# Patient Record
Sex: Female | Born: 1955
Health system: Southern US, Community
[De-identification: ages and names within clinical notes are randomized; demographics above are authoritative.]

## PROBLEM LIST (undated history)

## (undated) DIAGNOSIS — E079 Disorder of thyroid, unspecified: Secondary | ICD-10-CM

## (undated) DIAGNOSIS — T7840XA Allergy, unspecified, initial encounter: Secondary | ICD-10-CM

## (undated) HISTORY — DX: Allergy, unspecified, initial encounter: T78.40XA

## (undated) HISTORY — DX: Disorder of thyroid, unspecified: E07.9

## (undated) HISTORY — PX: TUBAL LIGATION: SHX77

---

## 2007-07-05 LAB — HM COLONOSCOPY: HM COLON: NORMAL

## 2014-04-10 LAB — TSH: TSH: 3 u[IU]/mL (ref <=5.90)

## 2014-04-10 LAB — LIPID PANEL
CHOLESTEROL: 243 mg/dL — AB (ref 0–200)
HDL: 55 mg/dL (ref 35–70)
LDL Cholesterol: 163 mg/dL
Triglycerides: 126 mg/dL (ref 40–160)

## 2014-04-10 LAB — CBC AND DIFFERENTIAL: Hemoglobin: 13.9 g/dL (ref 12.0–16.0)

## 2014-05-15 LAB — HM PAP SMEAR: HM PAP: NORMAL

## 2014-10-23 LAB — HM MAMMOGRAPHY: HM MAMMO: NORMAL

## 2015-04-21 ENCOUNTER — Other Ambulatory Visit: Payer: Self-pay | Admitting: Internal Medicine

## 2015-04-21 ENCOUNTER — Encounter: Payer: Self-pay | Admitting: Internal Medicine

## 2015-04-21 DIAGNOSIS — M722 Plantar fascial fibromatosis: Secondary | ICD-10-CM | POA: Insufficient documentation

## 2015-04-21 DIAGNOSIS — Z86010 Personal history of colonic polyps: Secondary | ICD-10-CM | POA: Insufficient documentation

## 2015-04-21 DIAGNOSIS — E782 Mixed hyperlipidemia: Secondary | ICD-10-CM | POA: Insufficient documentation

## 2015-04-21 DIAGNOSIS — R87615 Unsatisfactory cytologic smear of cervix: Secondary | ICD-10-CM | POA: Insufficient documentation

## 2015-04-21 DIAGNOSIS — N951 Menopausal and female climacteric states: Secondary | ICD-10-CM | POA: Insufficient documentation

## 2015-04-21 DIAGNOSIS — E039 Hypothyroidism, unspecified: Secondary | ICD-10-CM | POA: Insufficient documentation

## 2015-04-21 DIAGNOSIS — Z8709 Personal history of other diseases of the respiratory system: Secondary | ICD-10-CM | POA: Insufficient documentation

## 2015-05-14 ENCOUNTER — Encounter: Payer: Self-pay | Admitting: Internal Medicine

## 2015-05-14 ENCOUNTER — Ambulatory Visit (INDEPENDENT_AMBULATORY_CARE_PROVIDER_SITE_OTHER): Payer: 59 | Admitting: Internal Medicine

## 2015-05-14 ENCOUNTER — Ambulatory Visit: Payer: Self-pay | Admitting: Internal Medicine

## 2015-05-14 VITALS — BP 136/84 | HR 72 | Ht 60.0 in | Wt 144.8 lb

## 2015-05-14 DIAGNOSIS — E038 Other specified hypothyroidism: Secondary | ICD-10-CM

## 2015-05-14 DIAGNOSIS — K5900 Constipation, unspecified: Secondary | ICD-10-CM | POA: Insufficient documentation

## 2015-05-14 DIAGNOSIS — Z23 Encounter for immunization: Secondary | ICD-10-CM | POA: Diagnosis not present

## 2015-05-14 DIAGNOSIS — E7849 Other hyperlipidemia: Secondary | ICD-10-CM

## 2015-05-14 DIAGNOSIS — Z1239 Encounter for other screening for malignant neoplasm of breast: Secondary | ICD-10-CM | POA: Diagnosis not present

## 2015-05-14 DIAGNOSIS — E034 Atrophy of thyroid (acquired): Secondary | ICD-10-CM | POA: Diagnosis not present

## 2015-05-14 DIAGNOSIS — Z0000 Encounter for general adult medical examination without abnormal findings: Secondary | ICD-10-CM | POA: Diagnosis not present

## 2015-05-14 DIAGNOSIS — E784 Other hyperlipidemia: Secondary | ICD-10-CM | POA: Diagnosis not present

## 2015-05-14 LAB — POCT URINALYSIS DIPSTICK
BILIRUBIN UA: NEGATIVE
Glucose, UA: NEGATIVE
Ketones, UA: NEGATIVE
Leukocytes, UA: NEGATIVE
NITRITE UA: NEGATIVE
PH UA: 5
PROTEIN UA: NEGATIVE
RBC UA: NEGATIVE
Spec Grav, UA: 1.01
UROBILINOGEN UA: 0.2

## 2015-05-14 MED ORDER — LEVOTHYROXINE SODIUM 50 MCG PO TABS: 50.0000 ug | ORAL_TABLET | Freq: Every day | ORAL | Status: DC

## 2015-05-14 NOTE — Progress Notes (Signed)
Date:  05/14/2015   Name:  Evelyn Daniels   DOB:  07-02-1956   MRN:  [id number]   Chief Complaint: Annual Exam and Hypothyroidism Thyroid Problem Presents for follow-up visit. Symptoms include constipation. Patient reports no anxiety, depressed mood, diaphoresis, hair loss, hoarse voice, leg swelling, menstrual problem, palpitations, tremors or weight loss. The symptoms have been stable. Past treatments include levothyroxine. The treatment provided significant relief.  Constipation This is a chronic problem. The problem is unchanged. Her stool frequency is 1 time per day. The stool is described as scybalous. The patient is on a high fiber diet. She does not exercise regularly. There has been adequate water intake. Associated symptoms include hemorrhoids. Pertinent negatives include no abdominal pain, fever, hematochezia, melena, vomiting or weight loss. Treatments tried: herbal supplement. The treatment provided no relief.    Evelyn Daniels is a 59 y.o. female who presents today for her Complete Annual Exam. She feels fairly well. She reports exercising none. She reports she is sleeping well. She has no breast problems.  Mammogram is due in April.   Review of Systems  Constitutional: Negative for fever, chills, weight loss, diaphoresis and appetite change.  HENT: Negative for ear pain, hoarse voice, trouble swallowing and voice change.   Eyes: Negative for visual disturbance.  Respiratory: Negative for cough, chest tightness and shortness of breath.   Cardiovascular: Negative for chest pain, palpitations and leg swelling.  Gastrointestinal: Positive for constipation and hemorrhoids. Negative for vomiting, abdominal pain, melena, hematochezia and anal bleeding.  Genitourinary: Negative for dysuria, hematuria, vaginal bleeding, vaginal discharge, vaginal pain, menstrual problem and pelvic pain.  Musculoskeletal: Positive for arthralgias (knee and shoulder on left). Negative for joint swelling and gait  problem.  Skin: Negative for rash.  Neurological: Negative for tremors, weakness, light-headedness, numbness and headaches.  Hematological: Negative for adenopathy. Does not bruise/bleed easily.  Psychiatric/Behavioral: Negative for dysphoric mood. The patient is not nervous/anxious.     Patient Active Problem List   Diagnosis Date Noted  . Hypothyroidism 04/21/2015  . Familial multiple lipoprotein-type hyperlipidemia 04/21/2015  . History of hay fever 04/21/2015  . History of colon polyps 04/21/2015  . Hot flash, menopausal 04/21/2015  . Plantar fasciitis 04/21/2015  . Unsatisfactory cytologic smear of cervix 04/21/2015    Prior to Admission medications   Medication Sig Start Date End Date Taking? Authorizing Provider  Cholecalciferol 1000 UNITS capsule Take by mouth.   Yes Historical Provider, MD  Cinnamon 500 MG capsule Take 500 mg by mouth daily.   Yes Historical Provider, MD  levothyroxine (SYNTHROID) 50 MCG tablet Take 1 tablet by mouth daily. 06/02/14  Yes Historical Provider, MD  loratadine (CLARITIN) 10 MG tablet Take 1 tablet by mouth daily.   Yes Historical Provider, MD  Multiple Vitamins-Minerals (WOMENS MULTIVITAMIN PLUS) TABS Take 1 tablet by mouth daily as needed.   Yes Historical Provider, MD  Omega-3 Fatty Acids (FISH OIL) 1000 MG CAPS    Yes Historical Provider, MD  vitamin C (ASCORBIC ACID) 500 MG tablet Take 500 mg by mouth daily.   Yes Historical Provider, MD    Allergies  Allergen Reactions  . Codeine Anaphylaxis    Past Surgical History  Procedure Laterality Date  . Tubal ligation      Social History  Substance Use Topics  . Smoking status: Never Smoker   . Smokeless tobacco: None  . Alcohol Use: 1.2 oz/week    2 Standard drinks or equivalent per week    Medication list has been reviewed  and updated.   Physical Exam  Constitutional: She is oriented to person, place, and time. She appears well-developed and well-nourished. No distress.  HENT:   Head: Normocephalic and atraumatic.  Right Ear: Tympanic membrane and ear canal normal.  Left Ear: Tympanic membrane and ear canal normal.  Nose: Right sinus exhibits no maxillary sinus tenderness. Left sinus exhibits no maxillary sinus tenderness.  Mouth/Throat: Uvula is midline and oropharynx is clear and moist.  Eyes: Conjunctivae and EOM are normal. Right eye exhibits no discharge. Left eye exhibits no discharge. No scleral icterus.  Neck: Normal range of motion. Carotid bruit is not present. No erythema present. No thyromegaly present.  Cardiovascular: Normal rate, regular rhythm, normal heart sounds and normal pulses.   Pulmonary/Chest: Effort normal. No respiratory distress. She has no wheezes. Right breast exhibits no mass, no nipple discharge, no skin change and no tenderness. Left breast exhibits no mass, no nipple discharge, no skin change and no tenderness.  Abdominal: Soft. Bowel sounds are normal. There is no hepatosplenomegaly. There is no tenderness. There is no CVA tenderness.  Musculoskeletal: Normal range of motion.  Lymphadenopathy:    She has no cervical adenopathy.    She has no axillary adenopathy.  Neurological: She is alert and oriented to person, place, and time. She has normal reflexes. No cranial nerve deficit or sensory deficit.  Skin: Skin is warm, dry and intact. No rash noted.  Psychiatric: She has a normal mood and affect. Her speech is normal and behavior is normal. Thought content normal.  Nursing note and vitals reviewed.   BP 148/74 mmHg  Pulse 72  Ht 5' (1.524 m)  Wt 144 lb 12.8 oz (65.681 kg)  BMI 28.28 kg/m2  Assessment and Plan: 1. Annual physical exam Patient will schedule colonoscopy  - POCT urinalysis dipstick - CBC with Differential/Platelet - Comprehensive metabolic panel - Hepatitis C antibody  2. Flu vaccine need - Flu Vaccine QUAD 36+ mos PF IM (Fluarix & Fluzone Quad PF)  3. Hypothyroidism due to acquired atrophy of  thyroid Supplemented; adjust dose as needed - TSH - levothyroxine (SYNTHROID) 50 MCG tablet; Take 1 tablet (50 mcg total) by mouth daily.  Dispense: 30 tablet; Refill: 12  4. Familial multiple lipoprotein-type hyperlipidemia Will advise on medication when labs return Last assessment - 10 yr risk 3% - Lipid panel  5. Breast cancer screening Mammogram annually at [facility]  6. Need for diphtheria-tetanus-pertussis (Tdap) vaccine - Tdap vaccine greater than or equal to 7yo IM  7. Constipation, unspecified constipation type Recommend MiraLAX daily or stool softener with vegetable laxative   Halina Maidens, MD [facility]  05/14/2015

## 2015-05-14 NOTE — Patient Instructions (Addendum)
For Constipation - Miralax (glycolax) powder daily  Or Stool softener with vegetable laxative 1-2 daily as needed     Breast Self-Awareness Practicing breast self-awareness may pick up problems early, prevent significant medical complications, and possibly save your life. By practicing breast self-awareness, you can become familiar with how your breasts look and feel and if your breasts are changing. This allows you to notice changes early. It can also offer you some reassurance that your breast health is good. One way to learn what is normal for your breasts and whether your breasts are changing is to do a breast self-exam. If you find a lump or something that was not present in the past, it is best to contact your caregiver right away. Other findings that should be evaluated by your caregiver include nipple discharge, especially if it is bloody; skin changes or reddening; areas where the skin seems to be pulled in (retracted); or new lumps and bumps. Breast pain is seldom associated with cancer (malignancy), but should also be evaluated by a caregiver. HOW TO PERFORM A BREAST SELF-EXAM The best time to examine your breasts is 5-7 days after your menstrual period is over. During menstruation, the breasts are lumpier, and it may be more difficult to pick up changes. If you do not menstruate, have reached menopause, or had your uterus removed (hysterectomy), you should examine your breasts at regular intervals, such as monthly. If you are breastfeeding, examine your breasts after a feeding or after using a breast pump. Breast implants do not decrease the risk for lumps or tumors, so continue to perform breast self-exams as recommended. Talk to your caregiver about how to determine the difference between the implant and breast tissue. Also, talk about the amount of pressure you should use during the exam. Over time, you will become more familiar with the variations of your breasts and more comfortable with  the exam. A breast self-exam requires you to remove all your clothes above the waist.  Look at your breasts and nipples. Stand in front of a mirror in a room with good lighting. With your hands on your hips, push your hands firmly downward. Look for a difference in shape, contour, and size from one breast to the other (asymmetry). Asymmetry includes puckers, dips, or bumps. Also, look for skin changes, such as reddened or scaly areas on the breasts. Look for nipple changes, such as discharge, dimpling, repositioning, or redness.  Carefully feel your breasts. This is best done either in the shower or tub while using soapy water or when flat on your back. Place the arm (on the side of the breast you are examining) above your head. Use the pads (not the fingertips) of your three middle fingers on your opposite hand to feel your breasts. Start in the underarm area and use  inch (2 cm) overlapping circles to feel your breast. Use 3 different levels of pressure (light, medium, and firm pressure) at each circle before moving to the next circle. The light pressure is needed to feel the tissue closest to the skin. The medium pressure will help to feel breast tissue a little deeper, while the firm pressure is needed to feel the tissue close to the ribs. Continue the overlapping circles, moving downward over the breast until you feel your ribs below your breast. Then, move one finger-width towards the center of the body. Continue to use the  inch (2 cm) overlapping circles to feel your breast as you move slowly up toward the collar  bone (clavicle) near the base of the neck. Continue the up and down exam using all 3 pressures until you reach the middle of the chest. Do this with each breast, carefully feeling for lumps or changes.   Keep a written record with breast changes or normal findings for each breast. By writing this information down, you do not need to depend only on memory for size, tenderness, or location.  Write down where you are in your menstrual cycle, if you are still menstruating. Breast tissue can have some lumps or thick tissue. However, see your caregiver if you find anything that concerns you.  SEEK MEDICAL CARE IF:  You see a change in shape, contour, or size of your breasts or nipples.   You see skin changes, such as reddened or scaly areas on the breasts or nipples.   You have an unusual discharge from your nipples.   You feel a new lump or unusually thick areas.    This information is not intended to replace advice given to you by your health care provider. Make sure you discuss any questions you have with your health care provider.   Document Released: 06/19/2005 Document Revised: 06/05/2012 Document Reviewed: 10/04/2011 Elsevier Interactive Patient Education 2016 [employer]. Tdap Vaccine (Tetanus, Diphtheria and Pertussis): What You Need to Know 1. Why get vaccinated? Tetanus, diphtheria and pertussis are very serious diseases. Tdap vaccine can protect [place] from these diseases. And, Tdap vaccine given to pregnant women can protect newborn babies against pertussis. TETANUS (Lockjaw) is rare in the [place] today. It causes painful muscle tightening and stiffness, usually all over the body.  It can lead to tightening of muscles in the head and neck so you can't open your mouth, swallow, or sometimes even breathe. Tetanus kills about 1 out of 10 people who are infected even after receiving the best medical care. DIPHTHERIA is also rare in the [place] today. It can cause a thick coating to form in the back of the throat.  It can lead to breathing problems, heart failure, paralysis, and death. PERTUSSIS (Whooping Cough) causes severe coughing spells, which can cause difficulty breathing, vomiting and disturbed sleep.  It can also lead to weight loss, incontinence, and rib fractures. Up to 2 in 100 adolescents and 5 in 100 adults with pertussis are hospitalized or  have complications, which could include pneumonia or death. These diseases are caused by bacteria. Diphtheria and pertussis are spread from person to person through secretions from coughing or sneezing. Tetanus enters the body through cuts, scratches, or wounds. Before vaccines, as many as 200,000 cases of diphtheria, 200,000 cases of pertussis, and hundreds of cases of tetanus, were reported in the [place] each year. Since vaccination began, reports of cases for tetanus and diphtheria have dropped by about 99% and for pertussis by about 80%. 2. Tdap vaccine Tdap vaccine can protect adolescents and adults from tetanus, diphtheria, and pertussis. One dose of Tdap is routinely given at age 79 or 40. People who did not get Tdap at that age should get it as soon as possible. Tdap is especially important for healthcare professionals and anyone having close contact with a baby younger than 12 months. Pregnant women should get a dose of Tdap during every pregnancy, to protect the newborn from pertussis. Infants are most at risk for severe, life-threatening complications from pertussis. Another vaccine, called Td, protects against tetanus and diphtheria, but not pertussis. A Td booster should be given every 10 years. Tdap may  be given as one of these boosters if you have never gotten Tdap before. Tdap may also be given after a severe cut or burn to prevent tetanus infection. Your doctor or the person giving you the vaccine can give you more information. Tdap may safely be given at the same time as other vaccines. 3. Some people should not get this vaccine  A person who has ever had a life-threatening allergic reaction after a previous dose of any diphtheria, tetanus or pertussis containing vaccine, OR has a severe allergy to any part of this vaccine, should not get Tdap vaccine. Tell the person giving the vaccine about any severe allergies.  Anyone who had coma or long repeated seizures within 7 days  after a childhood dose of DTP or DTaP, or a previous dose of Tdap, should not get Tdap, unless a cause other than the vaccine was found. They can still get Td.  Talk to your doctor if you:  have seizures or another nervous system problem,  had severe pain or swelling after any vaccine containing diphtheria, tetanus or pertussis,  ever had a condition called Guillain-Barr Syndrome (GBS),  aren't feeling well on the day the shot is scheduled. 4. Risks With any medicine, including vaccines, there is a chance of side effects. These are usually mild and go away on their own. Serious reactions are also possible but are rare. Most people who get Tdap vaccine do not have any problems with it. Mild problems following Tdap (Did not interfere with activities)  Pain where the shot was given (about 3 in 4 adolescents or 2 in 3 adults)  Redness or swelling where the shot was given (about 1 person in 5)  Mild fever of at least 100.32F (up to about 1 in 25 adolescents or 1 in 100 adults)  Headache (about 3 or 4 people in 10)  Tiredness (about 1 person in 3 or 4)  Nausea, vomiting, diarrhea, stomach ache (up to 1 in 4 adolescents or 1 in 10 adults)  Chills, sore joints (about 1 person in 10)  Body aches (about 1 person in 3 or 4)  Rash, swollen glands (uncommon) Moderate problems following Tdap (Interfered with activities, but did not require medical attention)  Pain where the shot was given (up to 1 in 5 or 6)  Redness or swelling where the shot was given (up to about 1 in 16 adolescents or 1 in 12 adults)  Fever over 102F (about 1 in 100 adolescents or 1 in 250 adults)  Headache (about 1 in 7 adolescents or 1 in 10 adults)  Nausea, vomiting, diarrhea, stomach ache (up to 1 or 3 people in 100)  Swelling of the entire arm where the shot was given (up to about 1 in 500). Severe problems following Tdap (Unable to perform usual activities; required medical attention)  Swelling,  severe pain, bleeding and redness in the arm where the shot was given (rare). Problems that could happen after any vaccine:  People sometimes faint after a medical procedure, including vaccination. Sitting or lying down for about 15 minutes can help prevent fainting, and injuries caused by a fall. Tell your doctor if you feel dizzy, or have vision changes or ringing in the ears.  Some people get severe pain in the shoulder and have difficulty moving the arm where a shot was given. This happens very rarely.  Any medication can cause a severe allergic reaction. Such reactions from a vaccine are very rare, estimated at fewer than 1  in a million doses, and would happen within a few minutes to a few hours after the vaccination. As with any medicine, there is a very remote chance of a vaccine causing a serious injury or death. The safety of vaccines is always being monitored. For more information, visit: http://www.aguilar.org/ 5. What if there is a serious problem? What should I look for?  Look for anything that concerns you, such as signs of a severe allergic reaction, very high fever, or unusual behavior.  Signs of a severe allergic reaction can include hives, swelling of the face and throat, difficulty breathing, a fast heartbeat, dizziness, and weakness. These would usually start a few minutes to a few hours after the vaccination. What should I do?  If you think it is a severe allergic reaction or other emergency that can't wait, call 9-1-1 or get the person to the nearest hospital. Otherwise, call your doctor.  Afterward, the reaction should be reported to the Vaccine Adverse Event Reporting System (VAERS). Your doctor might file this report, or you can do it yourself through the VAERS web site at www.vaers.SamedayNews.es, or by calling (507) 284-3937. VAERS does not give medical advice.  6. The National Vaccine Injury Compensation Program The [employer] Vaccine Injury Compensation Program (VICP) is  a federal program that was created to compensate people who may have been injured by certain vaccines. Persons who believe they may have been injured by a vaccine can learn about the program and about filing a claim by calling 828-408-4070 or visiting the [place] website at GoldCloset.com.ee. There is a time limit to file a claim for compensation. 7. How can I learn more?  Ask your doctor. He or she can give you the vaccine package insert or suggest other sources of information.  Call your local or state health department.  Contact the Centers for Disease Control and Prevention (CDC):  Call 2812768622 (1-800-CDC-INFO) or  Visit CDC's website at http://hunter.com/ CDC Tdap Vaccine VIS (08/26/13)   This information is not intended to replace advice given to you by your health care provider. Make sure you discuss any questions you have with your health care provider.   Document Released: 12/19/2011 Document Revised: 07/10/2014 Document Reviewed: 10/01/2013 Elsevier Interactive Patient Education [employer].

## 2015-05-15 LAB — CBC WITH DIFFERENTIAL/PLATELET
BASOS ABS: 0.1 10*3/uL (ref 0.0–0.2)
Basos: 1 %
EOS (ABSOLUTE): 0.2 10*3/uL (ref 0.0–0.4)
EOS: 4 %
HEMOGLOBIN: 13.7 g/dL (ref 11.1–15.9)
Hematocrit: 40.5 % (ref 34.0–46.6)
Immature Grans (Abs): 0 10*3/uL (ref 0.0–0.1)
Immature Granulocytes: 0 %
LYMPHS ABS: 1.5 10*3/uL (ref 0.7–3.1)
Lymphs: 32 %
MCH: 31 pg (ref 26.6–33.0)
MCHC: 33.8 g/dL (ref 31.5–35.7)
MCV: 92 fL (ref 79–97)
MONOCYTES: 8 %
Monocytes Absolute: 0.4 10*3/uL (ref 0.1–0.9)
Neutrophils Absolute: 2.7 10*3/uL (ref 1.4–7.0)
Neutrophils: 55 %
PLATELETS: 215 10*3/uL (ref 150–379)
RBC: 4.42 x10E6/uL (ref 3.77–5.28)
RDW: 13.4 % (ref 12.3–15.4)
WBC: 4.9 10*3/uL (ref 3.4–10.8)

## 2015-05-15 LAB — COMPREHENSIVE METABOLIC PANEL
ALK PHOS: 90 IU/L (ref 39–117)
ALT: 22 IU/L (ref 0–32)
AST: 19 IU/L (ref 0–40)
Albumin/Globulin Ratio: 2 (ref 1.1–2.5)
Albumin: 4.5 g/dL (ref 3.6–4.8)
BILIRUBIN TOTAL: 0.3 mg/dL (ref 0.0–1.2)
BUN/Creatinine Ratio: 20 (ref 11–26)
BUN: 14 mg/dL (ref 8–27)
CHLORIDE: 102 mmol/L (ref 97–106)
CO2: 26 mmol/L (ref 18–29)
CREATININE: 0.71 mg/dL (ref 0.57–1.00)
Calcium: 9.5 mg/dL (ref 8.7–10.3)
GFR calc Af Amer: 107 mL/min/{1.73_m2} (ref >59)
GFR calc non Af Amer: 93 mL/min/{1.73_m2} (ref >59)
GLUCOSE: 83 mg/dL (ref 65–99)
Globulin, Total: 2.3 g/dL (ref 1.5–4.5)
Potassium: 4.5 mmol/L (ref 3.5–5.2)
Sodium: 142 mmol/L (ref 136–144)
Total Protein: 6.8 g/dL (ref 6.0–8.5)

## 2015-05-15 LAB — LIPID PANEL
CHOLESTEROL TOTAL: 220 mg/dL — AB (ref 100–199)
Chol/HDL Ratio: 4.5 ratio units — ABNORMAL HIGH (ref 0.0–4.4)
HDL: 49 mg/dL (ref >39)
LDL CALC: 148 mg/dL — AB (ref 0–99)
TRIGLYCERIDES: 117 mg/dL (ref 0–149)
VLDL CHOLESTEROL CAL: 23 mg/dL (ref 5–40)

## 2015-05-15 LAB — TSH: TSH: 3.57 u[IU]/mL (ref 0.450–4.500)

## 2015-05-15 LAB — HEPATITIS C ANTIBODY: Hep C Virus Ab: <0.1 s/co ratio (ref 0.0–0.9)

## 2015-06-11 ENCOUNTER — Encounter: Payer: Self-pay | Admitting: Internal Medicine

## 2015-07-30 ENCOUNTER — Encounter: Payer: Self-pay | Admitting: Internal Medicine

## 2015-11-05 ENCOUNTER — Ambulatory Visit: Payer: 59 | Admitting: Internal Medicine

## 2015-11-05 ENCOUNTER — Other Ambulatory Visit: Payer: Self-pay

## 2015-11-05 DIAGNOSIS — Z1211 Encounter for screening for malignant neoplasm of colon: Secondary | ICD-10-CM

## 2015-12-28 ENCOUNTER — Other Ambulatory Visit: Payer: Self-pay | Admitting: Internal Medicine

## 2015-12-28 DIAGNOSIS — Z1211 Encounter for screening for malignant neoplasm of colon: Secondary | ICD-10-CM

## 2016-01-18 ENCOUNTER — Telehealth: Payer: Self-pay

## 2016-01-18 NOTE — Telephone Encounter (Signed)
Called and advised patient that we ordered a screening colonoscopy not a dx

## 2016-02-25 ENCOUNTER — Encounter: Payer: Self-pay | Admitting: Internal Medicine

## 2016-03-03 LAB — HM COLONOSCOPY

## 2016-03-17 ENCOUNTER — Encounter: Payer: Self-pay | Admitting: Internal Medicine

## 2016-05-18 ENCOUNTER — Other Ambulatory Visit: Payer: Self-pay | Admitting: Internal Medicine

## 2016-05-18 DIAGNOSIS — E034 Atrophy of thyroid (acquired): Secondary | ICD-10-CM

## 2016-05-19 ENCOUNTER — Encounter: Payer: 59 | Admitting: Internal Medicine

## 2016-06-16 ENCOUNTER — Ambulatory Visit (INDEPENDENT_AMBULATORY_CARE_PROVIDER_SITE_OTHER): Payer: BLUE CROSS/BLUE SHIELD | Admitting: Internal Medicine

## 2016-06-16 ENCOUNTER — Encounter: Payer: Self-pay | Admitting: Internal Medicine

## 2016-06-16 VITALS — BP 122/82 | HR 84 | Temp 98.0°F | Ht 60.0 in | Wt 143.0 lb

## 2016-06-16 DIAGNOSIS — Z23 Encounter for immunization: Secondary | ICD-10-CM

## 2016-06-16 DIAGNOSIS — Z1231 Encounter for screening mammogram for malignant neoplasm of breast: Secondary | ICD-10-CM | POA: Diagnosis not present

## 2016-06-16 DIAGNOSIS — E782 Mixed hyperlipidemia: Secondary | ICD-10-CM | POA: Diagnosis not present

## 2016-06-16 DIAGNOSIS — Z0000 Encounter for general adult medical examination without abnormal findings: Secondary | ICD-10-CM | POA: Diagnosis not present

## 2016-06-16 DIAGNOSIS — E034 Atrophy of thyroid (acquired): Secondary | ICD-10-CM | POA: Diagnosis not present

## 2016-06-16 DIAGNOSIS — Z86010 Personal history of colonic polyps: Secondary | ICD-10-CM | POA: Diagnosis not present

## 2016-06-16 LAB — POCT URINALYSIS DIPSTICK
Bilirubin, UA: NEGATIVE
Blood, UA: NEGATIVE
GLUCOSE UA: NEGATIVE
KETONES UA: NEGATIVE
Leukocytes, UA: NEGATIVE
Nitrite, UA: NEGATIVE
Protein, UA: NEGATIVE
SPEC GRAV UA: 1.02
Urobilinogen, UA: 0.2
pH, UA: 6

## 2016-06-16 NOTE — Patient Instructions (Signed)
Health Maintenance  Topic Date Due  . HIV Screening  08/28/1970  . INFLUENZA VACCINE  01/31/2017  . PAP SMEAR  05/15/2017  . MAMMOGRAM  12/02/2016  . COLONOSCOPY  03/03/2021  . TETANUS/TDAP  05/13/2025  . ZOSTAVAX  Completed  . Hepatitis C Screening  Completed

## 2016-06-16 NOTE — Progress Notes (Signed)
Date:  06/16/2016   Name:  Evelyn Daniels   DOB:  June 19, 1956   MRN:  [id number]   Chief Complaint: Annual Exam Evelyn Daniels is a 60 y.o. female who presents today for her Complete Annual Exam. She feels well. She reports exercising intermittently. She reports she is sleeping well. Pap was done in 2015 and due in 2018.  Colonoscopy was earlier this year.  Thyroid Problem  Presents for follow-up visit. Patient reports no anxiety, constipation, depressed mood, diarrhea, fatigue, hair loss, leg swelling, palpitations, tremors or weight gain. The symptoms have been stable.    Review of Systems  Constitutional: Negative for chills, fatigue, fever and weight gain.  HENT: Negative for congestion, hearing loss, tinnitus, trouble swallowing and voice change.   Eyes: Negative for visual disturbance.  Respiratory: Negative for cough, chest tightness, shortness of breath and wheezing.   Cardiovascular: Negative for chest pain, palpitations and leg swelling.  Gastrointestinal: Negative for abdominal pain, constipation, diarrhea and vomiting.  Endocrine: Negative for polydipsia and polyuria.  Genitourinary: Negative for dysuria, frequency, genital sores, vaginal bleeding and vaginal discharge.  Musculoskeletal: Negative for arthralgias, gait problem and joint swelling.  Skin: Negative for color change and rash.  Neurological: Negative for dizziness, tremors, light-headedness and headaches.  Hematological: Negative for adenopathy. Does not bruise/bleed easily.  Psychiatric/Behavioral: Negative for dysphoric mood and sleep disturbance. The patient is not nervous/anxious.     Patient Active Problem List   Diagnosis Date Noted  . Hypothyroidism due to acquired atrophy of thyroid 05/14/2015  . Constipation 05/14/2015  . Mixed hyperlipidemia 04/21/2015  . History of hay fever 04/21/2015  . History of colon polyps 04/21/2015  . Hot flash, menopausal 04/21/2015  . Plantar fasciitis 04/21/2015    Prior  to Admission medications   Medication Sig Start Date End Date Taking? Authorizing Provider  Cholecalciferol 1000 UNITS capsule Take by mouth.   Yes Historical Provider, MD  Cinnamon 500 MG capsule Take 500 mg by mouth daily.   Yes Historical Provider, MD  levothyroxine (SYNTHROID, LEVOTHROID) 50 MCG tablet TAKE ONE TABLET BY MOUTH DAILY. 05/19/16  Yes Glean Hess, MD  loratadine (CLARITIN) 10 MG tablet Take 1 tablet by mouth daily.   Yes Historical Provider, MD  Multiple Vitamins-Minerals (WOMENS MULTIVITAMIN PLUS) TABS Take 1 tablet by mouth daily as needed.   Yes Historical Provider, MD  Omega-3 Fatty Acids (FISH OIL) 1000 MG CAPS    Yes Historical Provider, MD  vitamin C (ASCORBIC ACID) 500 MG tablet Take 500 mg by mouth daily.   Yes Historical Provider, MD    Allergies  Allergen Reactions  . Codeine Anaphylaxis    Past Surgical History:  Procedure Laterality Date  . TUBAL LIGATION      Social History  Substance Use Topics  . Smoking status: Never Smoker  . Smokeless tobacco: Never Used  . Alcohol use 1.2 oz/week    2 Standard drinks or equivalent per week     Medication list has been reviewed and updated.   Physical Exam  Constitutional: She is oriented to person, place, and time. She appears well-developed and well-nourished. No distress.  HENT:  Head: Normocephalic and atraumatic.  Right Ear: Tympanic membrane and ear canal normal.  Left Ear: Tympanic membrane and ear canal normal.  Nose: Right sinus exhibits no maxillary sinus tenderness. Left sinus exhibits no maxillary sinus tenderness.  Mouth/Throat: Uvula is midline and oropharynx is clear and moist.  Eyes: Conjunctivae and EOM are normal. Right eye exhibits  no discharge. Left eye exhibits no discharge. No scleral icterus.  Neck: Normal range of motion. Carotid bruit is not present. No erythema present. No thyromegaly present.  Cardiovascular: Normal rate, regular rhythm, normal heart sounds and normal  pulses.   Pulmonary/Chest: Effort normal. No respiratory distress. She has no wheezes. Right breast exhibits no mass, no nipple discharge, no skin change and no tenderness. Left breast exhibits no mass, no nipple discharge, no skin change and no tenderness.  Abdominal: Soft. Bowel sounds are normal. There is no hepatosplenomegaly. There is no tenderness. There is no CVA tenderness.  Musculoskeletal: Normal range of motion.  Lymphadenopathy:    She has no cervical adenopathy.    She has no axillary adenopathy.  Neurological: She is alert and oriented to person, place, and time. She has normal reflexes. No cranial nerve deficit or sensory deficit.  Skin: Skin is warm, dry and intact. No rash noted.  Psychiatric: She has a normal mood and affect. Her speech is normal and behavior is normal. Thought content normal.  Nursing note and vitals reviewed.   BP 122/82   Pulse 84   Temp 98 F (36.7 C)   Ht 5' (1.524 m)   Wt 143 lb (64.9 kg)   SpO2 98%   BMI 27.93 kg/m   Assessment and Plan: 1. Annual physical exam Flu vaccine today Begin regular exercise - CBC with Differential/Platelet - POCT urinalysis dipstick  2. Hypothyroidism due to acquired atrophy of thyroid supplemented - TSH  3. Mixed hyperlipidemia Will advise regarding medications - Comprehensive metabolic panel - Lipid panel  4. History of colon polyps Recent colonoscopy done - repeat in 5 yrs  5. Encounter for screening mammogram for breast cancer Schedule at [facility] in June - [place], MD [facility]  06/16/2016

## 2016-06-17 LAB — LIPID PANEL
CHOL/HDL RATIO: 4.3 ratio (ref 0.0–4.4)
CHOLESTEROL TOTAL: 204 mg/dL — AB (ref 100–199)
HDL: 47 mg/dL (ref >39)
LDL Calculated: 138 mg/dL — ABNORMAL HIGH (ref 0–99)
TRIGLYCERIDES: 95 mg/dL (ref 0–149)
VLDL Cholesterol Cal: 19 mg/dL (ref 5–40)

## 2016-06-17 LAB — CBC WITH DIFFERENTIAL/PLATELET
Basophils Absolute: 0 10*3/uL (ref 0.0–0.2)
Basos: 1 %
EOS (ABSOLUTE): 0.2 10*3/uL (ref 0.0–0.4)
Eos: 4 %
Hematocrit: 37.7 % (ref 34.0–46.6)
Hemoglobin: 12.9 g/dL (ref 11.1–15.9)
IMMATURE GRANULOCYTES: 0 %
Immature Grans (Abs): 0 10*3/uL (ref 0.0–0.1)
LYMPHS ABS: 1.6 10*3/uL (ref 0.7–3.1)
Lymphs: 29 %
MCH: 31.2 pg (ref 26.6–33.0)
MCHC: 34.2 g/dL (ref 31.5–35.7)
MCV: 91 fL (ref 79–97)
MONOS ABS: 0.4 10*3/uL (ref 0.1–0.9)
Monocytes: 6 %
NEUTROS PCT: 60 %
Neutrophils Absolute: 3.3 10*3/uL (ref 1.4–7.0)
PLATELETS: 205 10*3/uL (ref 150–379)
RBC: 4.13 x10E6/uL (ref 3.77–5.28)
RDW: 13.1 % (ref 12.3–15.4)
WBC: 5.5 10*3/uL (ref 3.4–10.8)

## 2016-06-17 LAB — COMPREHENSIVE METABOLIC PANEL
A/G RATIO: 1.6 (ref 1.2–2.2)
ALK PHOS: 80 IU/L (ref 39–117)
ALT: 23 IU/L (ref 0–32)
AST: 16 IU/L (ref 0–40)
Albumin: 4.1 g/dL (ref 3.6–4.8)
BUN/Creatinine Ratio: 21 (ref 12–28)
BUN: 16 mg/dL (ref 8–27)
Bilirubin Total: 0.2 mg/dL (ref 0.0–1.2)
CALCIUM: 9.3 mg/dL (ref 8.7–10.3)
CO2: 25 mmol/L (ref 18–29)
Chloride: 106 mmol/L (ref 96–106)
Creatinine, Ser: 0.77 mg/dL (ref 0.57–1.00)
GFR calc Af Amer: 96 mL/min/{1.73_m2} (ref >59)
GFR, EST NON AFRICAN AMERICAN: 84 mL/min/{1.73_m2} (ref >59)
Globulin, Total: 2.5 g/dL (ref 1.5–4.5)
Glucose: 103 mg/dL — ABNORMAL HIGH (ref 65–99)
POTASSIUM: 4.4 mmol/L (ref 3.5–5.2)
SODIUM: 144 mmol/L (ref 134–144)
Total Protein: 6.6 g/dL (ref 6.0–8.5)

## 2016-06-17 LAB — TSH: TSH: 3.16 u[IU]/mL (ref 0.450–4.500)

## 2016-07-19 ENCOUNTER — Encounter: Payer: Self-pay | Admitting: Internal Medicine

## 2016-12-08 LAB — HM MAMMOGRAPHY

## 2016-12-11 ENCOUNTER — Encounter: Payer: Self-pay | Admitting: Internal Medicine

## 2017-04-26 ENCOUNTER — Encounter: Payer: BLUE CROSS/BLUE SHIELD | Admitting: Internal Medicine

## 2017-04-27 ENCOUNTER — Encounter: Payer: Self-pay | Admitting: Internal Medicine

## 2017-04-27 ENCOUNTER — Ambulatory Visit (INDEPENDENT_AMBULATORY_CARE_PROVIDER_SITE_OTHER): Payer: BLUE CROSS/BLUE SHIELD | Admitting: Internal Medicine

## 2017-04-27 VITALS — BP 130/72 | HR 67 | Ht 60.0 in | Wt 139.0 lb

## 2017-04-27 DIAGNOSIS — Z1159 Encounter for screening for other viral diseases: Secondary | ICD-10-CM

## 2017-04-27 DIAGNOSIS — Z0184 Encounter for antibody response examination: Secondary | ICD-10-CM | POA: Diagnosis not present

## 2017-04-27 DIAGNOSIS — Z111 Encounter for screening for respiratory tuberculosis: Secondary | ICD-10-CM

## 2017-04-27 NOTE — Progress Notes (Signed)
Date:  04/27/2017   Name:  Evelyn Daniels   DOB:  December 24, 1955   MRN:  [id number]   Chief Complaint: employee form Employee Exam for labs, vaccinations, etc. 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  She needs to have titers for MMR, varicella, hep B, and tuberculosis test. She believes that she completed 2/3 Hep B vaccine series years ago.  Review of Systems  Constitutional: Positive for fatigue. Negative for chills, fever and unexpected weight change.  Respiratory: Negative for chest tightness, shortness of breath and wheezing.   Cardiovascular: Negative for chest pain and palpitations.  Skin: Negative for rash.  Neurological: Negative for dizziness and headaches.  Psychiatric/Behavioral: Negative for sleep disturbance.    Patient Active Problem List   Diagnosis Date Noted  . Hypothyroidism due to acquired atrophy of thyroid 05/14/2015  . Constipation 05/14/2015  . Mixed hyperlipidemia 04/21/2015  . History of hay fever 04/21/2015  . History of colon polyps 04/21/2015  . Hot flash, menopausal 04/21/2015  . Plantar fasciitis 04/21/2015    Prior to Admission medications   Medication Sig Start Date End Date Taking? Authorizing Provider  Cholecalciferol 1000 UNITS capsule Take by mouth.   Yes [provider]  Cinnamon 500 MG capsule Take 500 mg by mouth daily.   Yes [provider]  levothyroxine (SYNTHROID, LEVOTHROID) 50 MCG tablet TAKE ONE TABLET BY MOUTH DAILY. 05/19/16  Yes Glean Hess, MD  loratadine (CLARITIN) 10 MG tablet Take 1 tablet by mouth daily.   Yes [provider]  Multiple Vitamins-Minerals (WOMENS MULTIVITAMIN PLUS) TABS Take 1 tablet by mouth daily as needed.   Yes [provider]  Omega-3 Fatty Acids (FISH OIL) 1000 MG CAPS    Yes [provider]  vitamin C (ASCORBIC ACID) 500 MG tablet Take 500 mg by mouth daily.   Yes [provider]    Allergies  Allergen Reactions  . Codeine Anaphylaxis    Past Surgical History:  Procedure Laterality Date  . TUBAL LIGATION      Social History  Substance Use Topics  . Smoking status: Never Smoker  . Smokeless tobacco: Never Used  . Alcohol use 1.2 oz/week    2 Standard drinks or equivalent per week     Medication list has been reviewed and updated.  PHQ 2/9 Scores 06/16/2016  PHQ - 2 Score 0    Physical Exam  Constitutional: She is oriented to person, place, and time. She appears well-developed. No distress.  HENT:  Head: Normocephalic and atraumatic.  Neck: Carotid bruit is not present.  Cardiovascular: Normal rate, regular rhythm and normal heart sounds.   Pulmonary/Chest: Effort normal. No respiratory distress. She has no wheezes.  Musculoskeletal: Normal range of motion.  Neurological: She is alert and oriented to person, place, and time.  Skin: Skin is warm and dry. No rash noted.  Psychiatric: She has a normal mood and affect. Her behavior is normal. Thought content normal.  Nursing note and vitals reviewed.   BP 130/72   Pulse 67   Ht 5' (1.524 m)   Wt 139 lb (63 kg)   SpO2 98%   BMI 27.15 kg/m   Assessment and Plan: 1. Screening for tuberculosis - QuantiFERON-TB Gold Plus  2. Need for hepatitis B screening test Will likely need to complete full 3 vaccines series - Hepatitis B surface antibody  3. Immunity to measles, mumps, and  rubella determined by serologic test - [place] antibody IgG - Mumps antibody, IgG - Rubella screen  4. Immunity to varicella determined by serologic test - Varicella Zoster Abs, IgG/IgM   No orders of the defined types were placed in this encounter.   Partially dictated using Editor, commissioning. Any errors are unintentional.  Halina Maidens, MD [facility]  04/27/2017

## 2017-05-05 LAB — RUBELLA SCREEN: Rubella Antibodies, IGG: 10.1 index (ref >0.99)

## 2017-05-05 LAB — QUANTIFERON-TB GOLD PLUS
QuantiFERON Mitogen Value: >10 IU/mL
QuantiFERON Nil Value: 0.06 IU/mL
QuantiFERON TB1 Ag Value: 0.1 IU/mL
QuantiFERON TB2 Ag Value: 0.08 IU/mL
QuantiFERON-TB Gold Plus: NEGATIVE

## 2017-05-05 LAB — MUMPS ANTIBODY, IGG

## 2017-05-05 LAB — VARICELLA ZOSTER ABS, IGG/IGM: VARICELLA: 1978 {index} (ref >165)

## 2017-05-05 LAB — RUBEOLA ANTIBODY IGG: RUBEOLA AB, IGG: 43.9 [AU]/ml (ref >29.9)

## 2017-05-05 LAB — HEPATITIS B SURFACE ANTIBODY, QUANTITATIVE

## 2017-05-14 ENCOUNTER — Telehealth: Payer: Self-pay

## 2017-05-14 NOTE — Telephone Encounter (Signed)
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 Attached all titer results with the form- mailed form to patient today.

## 2017-08-10 ENCOUNTER — Encounter: Payer: BLUE CROSS/BLUE SHIELD | Admitting: Internal Medicine

## 2017-08-10 ENCOUNTER — Ambulatory Visit (INDEPENDENT_AMBULATORY_CARE_PROVIDER_SITE_OTHER): Payer: BLUE CROSS/BLUE SHIELD | Admitting: Internal Medicine

## 2017-08-10 ENCOUNTER — Encounter: Payer: Self-pay | Admitting: Internal Medicine

## 2017-08-10 VITALS — BP 136/78 | HR 62 | Ht 60.0 in | Wt 137.0 lb

## 2017-08-10 DIAGNOSIS — Z1239 Encounter for other screening for malignant neoplasm of breast: Secondary | ICD-10-CM

## 2017-08-10 DIAGNOSIS — Z0000 Encounter for general adult medical examination without abnormal findings: Secondary | ICD-10-CM | POA: Diagnosis not present

## 2017-08-10 DIAGNOSIS — Z1231 Encounter for screening mammogram for malignant neoplasm of breast: Secondary | ICD-10-CM | POA: Diagnosis not present

## 2017-08-10 DIAGNOSIS — E034 Atrophy of thyroid (acquired): Secondary | ICD-10-CM

## 2017-08-10 DIAGNOSIS — Z124 Encounter for screening for malignant neoplasm of cervix: Secondary | ICD-10-CM | POA: Diagnosis not present

## 2017-08-10 LAB — POCT URINALYSIS DIPSTICK
Bilirubin, UA: NEGATIVE
GLUCOSE UA: NEGATIVE
Ketones, UA: NEGATIVE
LEUKOCYTES UA: NEGATIVE
Nitrite, UA: NEGATIVE
Protein, UA: NEGATIVE
RBC UA: NEGATIVE
Spec Grav, UA: 1.015 (ref 1.010–1.025)
Urobilinogen, UA: 0.2 E.U./dL
pH, UA: 6 (ref 5.0–8.0)

## 2017-08-10 NOTE — Progress Notes (Signed)
Date:  08/10/2017   Name:  Evelyn Daniels   DOB:  1955/11/29   MRN:  [id number]   Chief Complaint: Annual Exam (Breast Exam and Pap. ) Evelyn Daniels is a 62 y.o. female who presents today for her Complete Annual Exam. She feels well. She reports exercising sometimes walking. She reports she is sleeping well. She is due for a mammogram this summer - no breast problems.  She is also due for a pap smear.  Thyroid Problem  Presents for follow-up visit. Patient reports no anxiety, constipation, diarrhea, fatigue, palpitations or tremors. The symptoms have been stable.    Review of Systems  Constitutional: Negative for chills, fatigue and fever.  HENT: Negative for congestion, hearing loss, tinnitus, trouble swallowing and voice change.   Eyes: Negative for visual disturbance.  Respiratory: Negative for cough, chest tightness, shortness of breath and wheezing.   Cardiovascular: Negative for chest pain, palpitations and leg swelling.  Gastrointestinal: Negative for abdominal pain, constipation, diarrhea and vomiting.       Hemorrhoids  Endocrine: Negative for polydipsia and polyuria.  Genitourinary: Negative for dysuria, frequency, genital sores, vaginal bleeding and vaginal discharge.  Musculoskeletal: Negative for arthralgias, gait problem and joint swelling.  Skin: Negative for color change and rash.  Allergic/Immunologic: Negative for environmental allergies.  Neurological: Negative for dizziness, tremors, light-headedness and headaches.  Hematological: Negative for adenopathy. Does not bruise/bleed easily.  Psychiatric/Behavioral: Negative for dysphoric mood and sleep disturbance. The patient is not nervous/anxious.     Patient Active Problem List   Diagnosis Date Noted  . Hypothyroidism due to acquired atrophy of thyroid 05/14/2015  . Constipation 05/14/2015  . Mixed hyperlipidemia 04/21/2015  . History of hay fever 04/21/2015  . History of colon polyps 04/21/2015  . Hot flash,  menopausal 04/21/2015  . Plantar fasciitis 04/21/2015    Prior to Admission medications   Medication Sig Start Date End Date Taking? Authorizing Provider  Cholecalciferol 1000 UNITS capsule Take by mouth.   Yes [provider]  Cinnamon 500 MG capsule Take 500 mg by mouth daily.   Yes [provider]  levothyroxine (SYNTHROID, LEVOTHROID) 50 MCG tablet TAKE ONE TABLET BY MOUTH DAILY. 05/19/16  Yes Glean Hess, MD  loratadine (CLARITIN) 10 MG tablet Take 1 tablet by mouth daily.   Yes [provider]  Multiple Vitamins-Minerals (WOMENS MULTIVITAMIN PLUS) TABS Take 1 tablet by mouth daily as needed.   Yes [provider]  Omega-3 Fatty Acids (FISH OIL) 1000 MG CAPS    Yes [provider]  vitamin C (ASCORBIC ACID) 500 MG tablet Take 500 mg by mouth daily.   Yes [provider]    Allergies  Allergen Reactions  . Codeine Anaphylaxis    Past Surgical History:  Procedure Laterality Date  . TUBAL LIGATION      Social History   Tobacco Use  . Smoking status: Never Smoker  . Smokeless tobacco: Never Used  Substance Use Topics  . Alcohol use: Yes    Alcohol/week: 1.2 oz    Types: 2 Standard drinks or equivalent per week  . Drug use: No     Medication list has been reviewed and updated.  PHQ 2/9 Scores 08/10/2017 06/16/2016  PHQ - 2 Score 0 0  PHQ- 9 Score 0 -    Physical Exam  Constitutional: She is oriented to person, place, and time. She appears well-developed and well-nourished. No distress.  HENT:  Head: Normocephalic and atraumatic.  Right Ear: Tympanic membrane  and ear canal normal.  Left Ear: Tympanic membrane and ear canal normal.  Nose: Right sinus exhibits no maxillary sinus tenderness. Left sinus exhibits no maxillary sinus tenderness.  Mouth/Throat: Uvula is midline and oropharynx is clear and moist.  Eyes: Conjunctivae and EOM are normal. Right eye exhibits no discharge. Left eye exhibits no discharge.  No scleral icterus.  Neck: Normal range of motion. Carotid bruit is not present. No erythema present. No thyromegaly present.  Cardiovascular: Normal rate, regular rhythm, normal heart sounds and normal pulses.  Pulmonary/Chest: Effort normal. No respiratory distress. She has no wheezes. Right breast exhibits no mass, no nipple discharge, no skin change and no tenderness. Left breast exhibits no mass, no nipple discharge, no skin change and no tenderness.  Abdominal: Soft. Bowel sounds are normal. There is no hepatosplenomegaly. There is no tenderness. There is no CVA tenderness.  Genitourinary: Vagina normal and uterus normal. There is no tenderness, lesion or injury on the right labia. There is no tenderness, lesion or injury on the left labia. Cervix exhibits no motion tenderness and no discharge. Right adnexum displays no mass, no tenderness and no fullness. Left adnexum displays no mass, no tenderness and no fullness.  Musculoskeletal: Normal range of motion.  Lymphadenopathy:    She has no cervical adenopathy.    She has no axillary adenopathy.  Neurological: She is alert and oriented to person, place, and time. She has normal reflexes. No cranial nerve deficit or sensory deficit.  Skin: Skin is warm, dry and intact. No rash noted.  Psychiatric: She has a normal mood and affect. Her speech is normal and behavior is normal. Thought content normal.  Nursing note and vitals reviewed.   BP 136/78   Pulse 62   Ht 5' (1.524 m)   Wt 137 lb (62.1 kg)   SpO2 100%   BMI 26.76 kg/m   Assessment and Plan: 1. Annual physical exam Normal exam Resume regular exercise - CBC with Differential/Platelet - Comprehensive metabolic panel - Lipid panel - POCT urinalysis dipstick  2. Breast cancer screening Schedule at [facility] - MM DIGITAL SCREENING BILATERAL  3. Papanicolaou smear for cervical cancer screening - Pap IG and HPV (high risk) DNA detection  4. Hypothyroidism due to acquired atrophy of  thyroid supplemented - TSH   No orders of the defined types were placed in this encounter.   Partially dictated using Editor, commissioning. Any errors are unintentional.  Halina Maidens, MD [facility]  08/10/2017

## 2017-08-11 LAB — CBC WITH DIFFERENTIAL/PLATELET
BASOS ABS: 0.1 10*3/uL (ref 0.0–0.2)
Basos: 1 %
EOS (ABSOLUTE): 0.2 10*3/uL (ref 0.0–0.4)
Eos: 3 %
HEMOGLOBIN: 13.4 g/dL (ref 11.1–15.9)
Hematocrit: 39.4 % (ref 34.0–46.6)
IMMATURE GRANS (ABS): 0 10*3/uL (ref 0.0–0.1)
Immature Granulocytes: 0 %
LYMPHS: 34 %
Lymphocytes Absolute: 1.8 10*3/uL (ref 0.7–3.1)
MCH: 30.4 pg (ref 26.6–33.0)
MCHC: 34 g/dL (ref 31.5–35.7)
MCV: 89 fL (ref 79–97)
MONOCYTES: 9 %
Monocytes Absolute: 0.5 10*3/uL (ref 0.1–0.9)
NEUTROS ABS: 2.9 10*3/uL (ref 1.4–7.0)
Neutrophils: 53 %
Platelets: 187 10*3/uL (ref 150–379)
RBC: 4.41 x10E6/uL (ref 3.77–5.28)
RDW: 13.5 % (ref 12.3–15.4)
WBC: 5.4 10*3/uL (ref 3.4–10.8)

## 2017-08-11 LAB — LIPID PANEL
CHOLESTEROL TOTAL: 209 mg/dL — AB (ref 100–199)
Chol/HDL Ratio: 4.4 ratio (ref 0.0–4.4)
HDL: 48 mg/dL (ref >39)
LDL CALC: 139 mg/dL — AB (ref 0–99)
TRIGLYCERIDES: 111 mg/dL (ref 0–149)
VLDL CHOLESTEROL CAL: 22 mg/dL (ref 5–40)

## 2017-08-11 LAB — COMPREHENSIVE METABOLIC PANEL
ALK PHOS: 72 IU/L (ref 39–117)
ALT: 17 IU/L (ref 0–32)
AST: 14 IU/L (ref 0–40)
Albumin/Globulin Ratio: 1.7 (ref 1.2–2.2)
Albumin: 4.3 g/dL (ref 3.6–4.8)
BUN/Creatinine Ratio: 16 (ref 12–28)
BUN: 12 mg/dL (ref 8–27)
Bilirubin Total: 0.4 mg/dL (ref 0.0–1.2)
CO2: 24 mmol/L (ref 20–29)
CREATININE: 0.76 mg/dL (ref 0.57–1.00)
Calcium: 9.3 mg/dL (ref 8.7–10.3)
Chloride: 105 mmol/L (ref 96–106)
GFR calc Af Amer: 97 mL/min/{1.73_m2} (ref >59)
GFR calc non Af Amer: 84 mL/min/{1.73_m2} (ref >59)
GLUCOSE: 97 mg/dL (ref 65–99)
Globulin, Total: 2.6 g/dL (ref 1.5–4.5)
Potassium: 4.3 mmol/L (ref 3.5–5.2)
Sodium: 143 mmol/L (ref 134–144)
Total Protein: 6.9 g/dL (ref 6.0–8.5)

## 2017-08-11 LAB — TSH: TSH: 3.58 u[IU]/mL (ref 0.450–4.500)

## 2017-08-15 LAB — PAP IG AND HPV HIGH-RISK
HPV, high-risk: NEGATIVE
PAP SMEAR COMMENT: 0

## 2017-09-14 ENCOUNTER — Other Ambulatory Visit: Payer: Self-pay

## 2017-09-14 DIAGNOSIS — E034 Atrophy of thyroid (acquired): Secondary | ICD-10-CM

## 2017-09-14 MED ORDER — LEVOTHYROXINE SODIUM 50 MCG PO TABS: 50.0000 ug | ORAL_TABLET | Freq: Every day | ORAL | 3 refills | Status: DC

## 2017-12-20 ENCOUNTER — Encounter

## 2018-01-18 ENCOUNTER — Other Ambulatory Visit: Payer: Self-pay | Admitting: Internal Medicine

## 2018-01-18 ENCOUNTER — Telehealth: Payer: Self-pay

## 2018-01-18 DIAGNOSIS — K649 Unspecified hemorrhoids: Secondary | ICD-10-CM

## 2018-01-18 MED ORDER — HYDROCORTISONE 2.5 % RE CREA: 1.0000 "application " | TOPICAL_CREAM | Freq: Two times a day (BID) | RECTAL | 0 refills | Status: DC

## 2018-01-18 NOTE — Telephone Encounter (Signed)
Did she try lamisil over the counter or any other anti-fungal?  That is what she needs for the heat rash. If she can give me the name of the hemorrhoid med, I could refill it.

## 2018-01-18 NOTE — Telephone Encounter (Signed)
Called and LVM to inform. Waiting for call back about name of cream.

## 2018-01-18 NOTE — Telephone Encounter (Signed)
Patient called stating she has a heat rash under arms and in between thighs. Tried every cream she could OTC and nothing has helped. Wants to know if there is something we can recommend.  Also, has had hemorrhoids in the past- was given a cream previously that helped with this. Wanted to know if she can have that prescribed again. Tried finding cream on chart and could not find.  Please Advise.

## 2018-01-18 NOTE — Telephone Encounter (Signed)
Spoke with patient and she will try Lamisil for heat rash. She is unsure of the name of the hemorroid cream that worked in the past so she asked that you order "what you typically would" for this. Please Send to [place].

## 2018-01-30 ENCOUNTER — Other Ambulatory Visit: Payer: Self-pay | Admitting: Internal Medicine

## 2018-01-30 DIAGNOSIS — E034 Atrophy of thyroid (acquired): Secondary | ICD-10-CM

## 2018-08-16 ENCOUNTER — Encounter: Payer: BLUE CROSS/BLUE SHIELD | Admitting: Internal Medicine

## 2019-02-21 ENCOUNTER — Other Ambulatory Visit: Payer: Self-pay

## 2019-02-21 ENCOUNTER — Ambulatory Visit (INDEPENDENT_AMBULATORY_CARE_PROVIDER_SITE_OTHER): Payer: PRIVATE HEALTH INSURANCE | Admitting: Internal Medicine

## 2019-02-21 ENCOUNTER — Encounter: Payer: Self-pay | Admitting: Internal Medicine

## 2019-02-21 VITALS — BP 124/78 | HR 78 | Ht 60.0 in | Wt 141.0 lb

## 2019-02-21 DIAGNOSIS — Z1231 Encounter for screening mammogram for malignant neoplasm of breast: Secondary | ICD-10-CM

## 2019-02-21 DIAGNOSIS — Z0000 Encounter for general adult medical examination without abnormal findings: Secondary | ICD-10-CM

## 2019-02-21 DIAGNOSIS — E034 Atrophy of thyroid (acquired): Secondary | ICD-10-CM

## 2019-02-21 DIAGNOSIS — E782 Mixed hyperlipidemia: Secondary | ICD-10-CM

## 2019-02-21 DIAGNOSIS — Z13820 Encounter for screening for osteoporosis: Secondary | ICD-10-CM

## 2019-02-21 DIAGNOSIS — Z23 Encounter for immunization: Secondary | ICD-10-CM

## 2019-02-21 LAB — POCT URINALYSIS DIPSTICK
Bilirubin, UA: NEGATIVE
Blood, UA: NEGATIVE
Glucose, UA: NEGATIVE
Ketones, UA: NEGATIVE
Leukocytes, UA: NEGATIVE
Nitrite, UA: NEGATIVE
Protein, UA: NEGATIVE
Spec Grav, UA: 1.015 (ref 1.010–1.025)
Urobilinogen, UA: 0.2 E.U./dL
pH, UA: 6.5 (ref 5.0–8.0)

## 2019-02-21 MED ORDER — LEVOTHYROXINE SODIUM 50 MCG PO TABS: 50.0000 ug | ORAL_TABLET | Freq: Every day | ORAL | 12 refills | Status: DC

## 2019-02-21 NOTE — Progress Notes (Signed)
Date:  02/21/2019   Name:  Evelyn Daniels   DOB:  19-Jul-1955   MRN:  [id number]   Chief Complaint: Annual Exam (Breast Exam. No more paps.) Evelyn Daniels is a 63 y.o. female who presents today for her Complete Annual Exam. She feels well. She reports exercising rarely but plans to start. She reports she is sleeping well. She continues to work full time in a [employer]. She denies breast issues.  She has never had a DEXA.  Mammogram 2019 Colonoscopy 2017 Pap 2019 DEXA - none Immunizations -   Thyroid Problem Presents for follow-up visit. Patient reports no anxiety, constipation, diarrhea, fatigue, palpitations or tremors. The symptoms have been stable.   Lab Results  Component Value Date   CREATININE 0.76 08/10/2017   BUN 12 08/10/2017   NA 143 08/10/2017   K 4.3 08/10/2017   CL 105 08/10/2017   CO2 24 08/10/2017   Lab Results  Component Value Date   CHOL 209 (H) 08/10/2017   HDL 48 08/10/2017   LDLCALC 139 (H) 08/10/2017   TRIG 111 08/10/2017   CHOLHDL 4.4 08/10/2017     Review of Systems  Constitutional: Negative for chills, fatigue and fever.  HENT: Negative for congestion, hearing loss, tinnitus, trouble swallowing and voice change.   Eyes: Negative for visual disturbance.  Respiratory: Negative for cough, chest tightness, shortness of breath and wheezing.   Cardiovascular: Negative for chest pain, palpitations and leg swelling.  Gastrointestinal: Negative for abdominal pain, constipation, diarrhea and vomiting.  Endocrine: Negative for polydipsia and polyuria.  Genitourinary: Negative for dysuria, frequency, genital sores, vaginal bleeding and vaginal discharge.  Musculoskeletal: Negative for arthralgias, gait problem and joint swelling.  Skin: Negative for color change and rash.  Neurological: Negative for dizziness, tremors, light-headedness and headaches.  Hematological: Negative for adenopathy. Does not bruise/bleed easily.  Psychiatric/Behavioral: Negative  for dysphoric mood and sleep disturbance. The patient is not nervous/anxious.     Patient Active Problem List   Diagnosis Date Noted  . Hypothyroidism due to acquired atrophy of thyroid 05/14/2015  . Constipation 05/14/2015  . Mixed hyperlipidemia 04/21/2015  . History of hay fever 04/21/2015  . History of colon polyps 04/21/2015  . Hot flash, menopausal 04/21/2015  . Plantar fasciitis 04/21/2015    Allergies  Allergen Reactions  . Codeine Anaphylaxis    Past Surgical History:  Procedure Laterality Date  . TUBAL LIGATION      Social History   Tobacco Use  . Smoking status: Never Smoker  . Smokeless tobacco: Never Used  Substance Use Topics  . Alcohol use: Yes    Alcohol/week: 2.0 standard drinks    Types: 2 Standard drinks or equivalent per week  . Drug use: No     Medication list has been reviewed and updated.  Current Meds  Medication Sig  . Cholecalciferol 1000 UNITS capsule Take by mouth.  . Cinnamon 500 MG capsule Take 500 mg by mouth daily.  . hydrocortisone (ANUSOL-HC) 2.5 % rectal cream Place 1 application rectally 2 (two) times daily.  Marland Kitchen levothyroxine (SYNTHROID, LEVOTHROID) 50 MCG tablet TAKE 1 TABLET BY MOUTH EVERY DAY  . loratadine (CLARITIN) 10 MG tablet Take 1 tablet by mouth daily.  . Multiple Vitamins-Minerals (WOMENS MULTIVITAMIN PLUS) TABS Take 1 tablet by mouth daily as needed.  . Omega-3 Fatty Acids (FISH OIL) 1000 MG CAPS   . vitamin C (ASCORBIC ACID) 500 MG tablet Take 500 mg by mouth daily.    PHQ 2/9 Scores 02/21/2019  08/10/2017 06/16/2016  PHQ - 2 Score 0 0 0  PHQ- 9 Score - 0 -    BP Readings from Last 3 Encounters:  02/21/19 124/78  08/10/17 136/78  04/27/17 130/72    Physical Exam Vitals signs and nursing note reviewed.  Constitutional:      General: She is not in acute distress.    Appearance: She is well-developed.  HENT:     Head: Normocephalic and atraumatic.     Right Ear: Tympanic membrane and ear canal normal.      Left Ear: Tympanic membrane and ear canal normal.     Nose:     Right Sinus: No maxillary sinus tenderness.     Left Sinus: No maxillary sinus tenderness.  Eyes:     General: No scleral icterus.       Right eye: No discharge.        Left eye: No discharge.     Conjunctiva/sclera: Conjunctivae normal.  Neck:     Musculoskeletal: Normal range of motion. No erythema.     Thyroid: No thyromegaly.     Vascular: No carotid bruit.  Cardiovascular:     Rate and Rhythm: Normal rate and regular rhythm.     Pulses: Normal pulses.     Heart sounds: Normal heart sounds.  Pulmonary:     Effort: Pulmonary effort is normal. No respiratory distress.     Breath sounds: No wheezing.  Chest:     Breasts:        Right: No mass, nipple discharge, skin change or tenderness.        Left: No mass, nipple discharge, skin change or tenderness.  Abdominal:     General: Bowel sounds are normal.     Palpations: Abdomen is soft.     Tenderness: There is no abdominal tenderness.  Musculoskeletal: Normal range of motion.     Right lower leg: No edema.     Left lower leg: No edema.  Lymphadenopathy:     Cervical: No cervical adenopathy.  Skin:    General: Skin is warm and dry.     Capillary Refill: Capillary refill takes less than 2 seconds.     Findings: No lesion or rash.  Neurological:     General: No focal deficit present.     Mental Status: She is alert and oriented to person, place, and time.     Cranial Nerves: No cranial nerve deficit.     Sensory: No sensory deficit.     Motor: Motor function is intact.     Coordination: Coordination is intact.     Deep Tendon Reflexes: Reflexes are normal and symmetric.  Psychiatric:        Attention and Perception: Attention normal.        Mood and Affect: Mood normal.        Speech: Speech normal.        Behavior: Behavior normal.        Thought Content: Thought content normal.     Wt Readings from Last 3 Encounters:  02/21/19 141 lb (64 kg)   08/10/17 137 lb (62.1 kg)  04/27/17 139 lb (63 kg)    BP 124/78   Pulse 78   Ht 5' (1.524 m)   Wt 141 lb (64 kg)   SpO2 99%   BMI 27.54 kg/m   Assessment and Plan: 1. Annual physical exam Normal exam Continue healthy diet, more regular exercise - CBC with Differential/Platelet - Comprehensive metabolic panel - POCT urinalysis  dipstick  2. Encounter for screening mammogram for breast cancer Schedule at [facility] - MM 3D SCREEN BREAST BILATERAL; Future  3. Hypothyroidism due to acquired atrophy of thyroid On daily supplements without s/s of uncontrolled thyroid disease - TSH + free T4 - levothyroxine (SYNTHROID) 50 MCG tablet; Take 1 tablet (50 mcg total) by mouth daily.  Dispense: 30 tablet; Refill: 12  4. Mixed hyperlipidemia Low fat diet and exercise Will advise if medication is needed - Lipid panel  5. Encounter for screening for osteoporosis To be scheduled at [facility] - DG Bone Density; Future  6. Need for influenza vaccination - Flu Vaccine QUAD 36+ mos IM   Partially dictated using Editor, commissioning. Any errors are unintentional.  Halina Maidens, MD [facility]  02/21/2019

## 2019-02-22 LAB — CBC WITH DIFFERENTIAL/PLATELET
Basophils Absolute: 0.1 10*3/uL (ref 0.0–0.2)
Basos: 1 %
EOS (ABSOLUTE): 0.2 10*3/uL (ref 0.0–0.4)
Eos: 4 %
Hematocrit: 40 % (ref 34.0–46.6)
Hemoglobin: 13.7 g/dL (ref 11.1–15.9)
Immature Grans (Abs): 0 10*3/uL (ref 0.0–0.1)
Immature Granulocytes: 0 %
Lymphocytes Absolute: 1.5 10*3/uL (ref 0.7–3.1)
Lymphs: 30 %
MCH: 31.3 pg (ref 26.6–33.0)
MCHC: 34.3 g/dL (ref 31.5–35.7)
MCV: 91 fL (ref 79–97)
Monocytes Absolute: 0.4 10*3/uL (ref 0.1–0.9)
Monocytes: 8 %
Neutrophils Absolute: 2.9 10*3/uL (ref 1.4–7.0)
Neutrophils: 57 %
Platelets: 222 10*3/uL (ref 150–450)
RBC: 4.38 x10E6/uL (ref 3.77–5.28)
RDW: 12.2 % (ref 11.7–15.4)
WBC: 5.1 10*3/uL (ref 3.4–10.8)

## 2019-02-22 LAB — LIPID PANEL
Chol/HDL Ratio: 5.6 ratio — ABNORMAL HIGH (ref 0.0–4.4)
Cholesterol, Total: 250 mg/dL — ABNORMAL HIGH (ref 100–199)
HDL: 45 mg/dL (ref >39)
LDL Calculated: 181 mg/dL — ABNORMAL HIGH (ref 0–99)
Triglycerides: 120 mg/dL (ref 0–149)
VLDL Cholesterol Cal: 24 mg/dL (ref 5–40)

## 2019-02-22 LAB — COMPREHENSIVE METABOLIC PANEL
ALT: 17 IU/L (ref 0–32)
AST: 17 IU/L (ref 0–40)
Albumin/Globulin Ratio: 2 (ref 1.2–2.2)
Albumin: 4.5 g/dL (ref 3.8–4.8)
Alkaline Phosphatase: 74 IU/L (ref 39–117)
BUN/Creatinine Ratio: 23 (ref 12–28)
BUN: 15 mg/dL (ref 8–27)
Bilirubin Total: 0.4 mg/dL (ref 0.0–1.2)
CO2: 25 mmol/L (ref 20–29)
Calcium: 9.4 mg/dL (ref 8.7–10.3)
Chloride: 104 mmol/L (ref 96–106)
Creatinine, Ser: 0.65 mg/dL (ref 0.57–1.00)
GFR calc Af Amer: 109 mL/min/{1.73_m2} (ref >59)
GFR calc non Af Amer: 94 mL/min/{1.73_m2} (ref >59)
Globulin, Total: 2.2 g/dL (ref 1.5–4.5)
Glucose: 94 mg/dL (ref 65–99)
Potassium: 4.3 mmol/L (ref 3.5–5.2)
Sodium: 142 mmol/L (ref 134–144)
Total Protein: 6.7 g/dL (ref 6.0–8.5)

## 2019-02-22 LAB — TSH+FREE T4
Free T4: 1.13 ng/dL (ref 0.82–1.77)
TSH: 2.94 u[IU]/mL (ref 0.450–4.500)

## 2019-09-05 DIAGNOSIS — E2839 Other primary ovarian failure: Secondary | ICD-10-CM | POA: Diagnosis not present

## 2019-09-05 DIAGNOSIS — M8589 Other specified disorders of bone density and structure, multiple sites: Secondary | ICD-10-CM | POA: Diagnosis not present

## 2019-09-05 DIAGNOSIS — Z13820 Encounter for screening for osteoporosis: Secondary | ICD-10-CM | POA: Diagnosis not present

## 2019-09-05 DIAGNOSIS — Z1231 Encounter for screening mammogram for malignant neoplasm of breast: Secondary | ICD-10-CM | POA: Diagnosis not present

## 2019-09-11 ENCOUNTER — Encounter: Payer: Self-pay | Admitting: Internal Medicine

## 2019-09-11 ENCOUNTER — Telehealth: Payer: Self-pay | Admitting: Internal Medicine

## 2019-09-11 DIAGNOSIS — M8580 Other specified disorders of bone density and structure, unspecified site: Secondary | ICD-10-CM | POA: Insufficient documentation

## 2019-09-11 NOTE — Telephone Encounter (Signed)
Please let patient know that her bone density shows mild bone loss but not osteoporosis.  Take calcium 1200 mg and vitamin D [phone] IU daily.  Would repeat in 2 years.

## 2019-09-15 NOTE — Telephone Encounter (Signed)
Called and left VM to call back about bone density.  CM

## 2019-09-16 NOTE — Telephone Encounter (Signed)
Patient informed. Will start calcium and Vit D and repeat in 2 years.  CM

## 2019-10-06 ENCOUNTER — Encounter: Payer: Self-pay | Admitting: Internal Medicine

## 2019-12-19 ENCOUNTER — Other Ambulatory Visit: Payer: Self-pay

## 2019-12-19 ENCOUNTER — Encounter: Payer: Self-pay | Admitting: Internal Medicine

## 2019-12-19 ENCOUNTER — Ambulatory Visit (INDEPENDENT_AMBULATORY_CARE_PROVIDER_SITE_OTHER): Payer: Medicare HMO | Admitting: Internal Medicine

## 2019-12-19 VITALS — BP 118/76 | HR 75 | Temp 98.1°F | Ht 60.0 in | Wt 138.0 lb

## 2019-12-19 DIAGNOSIS — Z1231 Encounter for screening mammogram for malignant neoplasm of breast: Secondary | ICD-10-CM

## 2019-12-19 DIAGNOSIS — M8580 Other specified disorders of bone density and structure, unspecified site: Secondary | ICD-10-CM

## 2019-12-19 DIAGNOSIS — Z23 Encounter for immunization: Secondary | ICD-10-CM

## 2019-12-19 DIAGNOSIS — M79675 Pain in left toe(s): Secondary | ICD-10-CM

## 2019-12-19 DIAGNOSIS — E034 Atrophy of thyroid (acquired): Secondary | ICD-10-CM

## 2019-12-19 DIAGNOSIS — G8929 Other chronic pain: Secondary | ICD-10-CM | POA: Diagnosis not present

## 2019-12-19 DIAGNOSIS — Z0000 Encounter for general adult medical examination without abnormal findings: Secondary | ICD-10-CM

## 2019-12-19 LAB — POCT URINALYSIS DIPSTICK
Bilirubin, UA: NEGATIVE
Blood, UA: NEGATIVE
Glucose, UA: NEGATIVE
Ketones, UA: NEGATIVE
Leukocytes, UA: NEGATIVE
Nitrite, UA: NEGATIVE
Protein, UA: NEGATIVE
Spec Grav, UA: 1.015
Urobilinogen, UA: 0.2 U/dL
pH, UA: 6.5

## 2019-12-19 NOTE — Patient Instructions (Signed)
Schedule a Dermatology evaluation for a skin check.  Schedule a podiatry appt for toe pain.

## 2019-12-19 NOTE — Progress Notes (Signed)
Date:  12/19/2019    Name:  Evelyn Daniels   DOB:  05-28-1956   MRN:  [id number]   Chief Complaint: Annual Exam (breast exam / no pap/ [id number] )  Evelyn Daniels is a 64 y.o. female who presents today for her Complete Annual Exam. She feels well. She reports exercising regularly -walking daily. She reports she is sleeping well. Breast complaints -none.  She is due for her first pneumonia vaccine.  Mammogram:  09/2019 Pap smear:  08/2017 DEXA - 09/2019 osteopenia Colonoscopy:  03/2016  Immunization History  Administered Date(s) Administered  . Influenza,inj,Quad PF,6+ Mos 05/14/2015, 06/16/2016, 02/21/2019  . Influenza-Unspecified 04/16/2017  . Moderna SARS-COVID-2 Vaccination 08/15/2019, 09/15/2019  . Tdap 05/14/2015  . Zoster 05/19/2011  . Zoster Recombinat (Shingrix) 08/22/2018, 12/21/2018    Thyroid Problem Presents for follow-up visit. Patient reports no anxiety, constipation, depressed mood, diarrhea, fatigue, palpitations, tremors or weight gain. The symptoms have been stable.  Toe Pain  There was no injury mechanism. The pain is present in the left toes. The pain is mild. Associated symptoms include an inability to bear weight. Treatments tried: seen last year by podiatry for corn/callus.    Lab Results  Component Value Date   CREATININE 0.65 02/21/2019   BUN 15 02/21/2019   NA 142 02/21/2019   K 4.3 02/21/2019   CL 104 02/21/2019   CO2 25 02/21/2019   Lab Results  Component Value Date   CHOL 250 (H) 02/21/2019   HDL 45 02/21/2019   LDLCALC 181 (H) 02/21/2019   TRIG 120 02/21/2019   CHOLHDL 5.6 (H) 02/21/2019   Lab Results  Component Value Date   TSH 2.940 02/21/2019   No results found for: HGBA1C Lab Results  Component Value Date   WBC 5.1 02/21/2019   HGB 13.7 02/21/2019   HCT 40.0 02/21/2019   MCV 91 02/21/2019   PLT 222 02/21/2019   Lab Results  Component Value Date   ALT 17 02/21/2019   AST 17 02/21/2019   ALKPHOS 74 02/21/2019   BILITOT 0.4  02/21/2019     Review of Systems  Constitutional: Negative for chills, fatigue, fever and weight gain.  HENT: Negative for congestion, hearing loss, tinnitus, trouble swallowing and voice change.   Eyes: Negative for visual disturbance.  Respiratory: Negative for cough, chest tightness, shortness of breath and wheezing.   Cardiovascular: Negative for chest pain, palpitations and leg swelling.  Gastrointestinal: Negative for abdominal pain, constipation, diarrhea and vomiting.  Endocrine: Negative for polydipsia and polyuria.  Genitourinary: Negative for dysuria, frequency, genital sores, vaginal bleeding and vaginal discharge.  Musculoskeletal: Positive for arthralgias (left foot pain in 4th and 5th toes). Negative for gait problem and joint swelling.  Skin: Negative for color change and rash.  Neurological: Negative for dizziness, tremors, light-headedness and headaches.  Hematological: Negative for adenopathy. Does not bruise/bleed easily.  Psychiatric/Behavioral: Negative for dysphoric mood and sleep disturbance. The patient is not nervous/anxious.     Patient Active Problem List   Diagnosis Date Noted  . Osteopenia determined by x-ray 09/11/2019  . Hypothyroidism due to acquired atrophy of thyroid 05/14/2015  . Constipation 05/14/2015  . Mixed hyperlipidemia 04/21/2015  . History of hay fever 04/21/2015  . History of colon polyps 04/21/2015  . Hot flash, menopausal 04/21/2015  . Plantar fasciitis 04/21/2015    Allergies  Allergen Reactions  . Codeine Anaphylaxis    Past Surgical History:  Procedure Laterality Date  . TUBAL LIGATION      Social  History   Tobacco Use  . Smoking status: Never Smoker  . Smokeless tobacco: Never Used  Substance Use Topics  . Alcohol use: Yes    Alcohol/week: 2.0 standard drinks    Types: 2 Standard drinks or equivalent per week  . Drug use: No     Medication list has been reviewed and updated.  Current Meds  Medication Sig  .  Apoaequorin (PREVAGEN PO) Take 1 tablet by mouth daily.  . Calcium 200 MG TABS Take 650 mg by mouth daily. One in AM one in PM  . Cholecalciferol 1000 UNITS capsule Take by mouth.  . Cinnamon 500 MG capsule Take 500 mg by mouth daily.  . diphenhydrAMINE HCl (ALLERGY MED PO) Take 1 tablet by mouth daily.  Marland Kitchen levothyroxine (SYNTHROID) 50 MCG tablet Take 1 tablet (50 mcg total) by mouth daily.  Marland Kitchen loratadine (CLARITIN) 10 MG tablet Take 1 tablet by mouth daily.  . Multiple Vitamins-Minerals (WOMENS MULTIVITAMIN PLUS) TABS Take 1 tablet by mouth daily as needed.  . Omega-3 Fatty Acids (FISH OIL) 1000 MG CAPS   . vitamin C (ASCORBIC ACID) 500 MG tablet Take 500 mg by mouth daily.  . [DISCONTINUED] Apoaequorin (PREVAGEN EXTRA STRENGTH PO) Take by mouth.    PHQ 2/9 Scores 12/19/2019 02/21/2019 08/10/2017 06/16/2016  PHQ - 2 Score 0 0 0 0  PHQ- 9 Score 0 - 0 -    GAD 7 : Generalized Anxiety Score 12/19/2019  Nervous, Anxious, on Edge 0  Control/stop worrying 0  Worry too much - different things 0  Trouble relaxing 0  Restless 0  Easily annoyed or irritable 0  Afraid - awful might happen 0  Total GAD 7 Score 0  Anxiety Difficulty Not difficult at all    BP Readings from Last 3 Encounters:  12/19/19 118/76  02/21/19 124/78  08/10/17 136/78    Physical Exam Vitals and nursing note reviewed.  Constitutional:      General: She is not in acute distress.    Appearance: She is well-developed.  HENT:     Head: Normocephalic and atraumatic.     Right Ear: Tympanic membrane normal.     Left Ear: Tympanic membrane and ear canal normal.     Ears:     Comments: Mild eczema of right ear canal    Nose:     Right Sinus: No maxillary sinus tenderness.     Left Sinus: No maxillary sinus tenderness.  Eyes:     General: No scleral icterus.       Right eye: No discharge.        Left eye: No discharge.     Conjunctiva/sclera: Conjunctivae normal.  Neck:     Thyroid: No thyromegaly.     Vascular:  No carotid bruit.  Cardiovascular:     Rate and Rhythm: Normal rate and regular rhythm.     Pulses: Normal pulses.     Heart sounds: Normal heart sounds.  Pulmonary:     Effort: Pulmonary effort is normal. No respiratory distress.     Breath sounds: No wheezing.  Chest:     Breasts:        Right: No mass, nipple discharge, skin change or tenderness.        Left: No mass, nipple discharge, skin change or tenderness.  Abdominal:     General: Bowel sounds are normal.     Palpations: Abdomen is soft.     Tenderness: There is no abdominal tenderness.  Musculoskeletal:  General: Normal range of motion.     Cervical back: Normal range of motion. No erythema.     Right lower leg: No edema.     Left lower leg: No edema.  Lymphadenopathy:     Cervical: No cervical adenopathy.  Skin:    General: Skin is warm and dry.     Capillary Refill: Capillary refill takes less than 2 seconds.     Findings: No rash.  Neurological:     General: No focal deficit present.     Mental Status: She is alert and oriented to person, place, and time.     Cranial Nerves: No cranial nerve deficit.     Sensory: No sensory deficit.     Deep Tendon Reflexes: Reflexes are normal and symmetric.  Psychiatric:        Mood and Affect: Mood normal.        Speech: Speech normal.     Wt Readings from Last 3 Encounters:  12/19/19 138 lb (62.6 kg)  02/21/19 141 lb (64 kg)  08/10/17 137 lb (62.1 kg)    BP 118/76   Pulse 75   Temp 98.1 F (36.7 C) (Oral)   Ht 5' (1.524 m)   Wt 138 lb (62.6 kg)   SpO2 96%   BMI 26.95 kg/m   Assessment and Plan: 1. Annual physical exam Normal exam Continue healthy diet and exercise Use otc cortisone cream to right ear canal daily PRN - CBC with Differential/Platelet - Comprehensive metabolic panel - Lipid panel - POCT urinalysis dipstick  2. Encounter for screening mammogram for breast cancer Order for DDI given  3. Need for vaccination for pneumococcus -  Pneumococcal conjugate vaccine 13-valent IM  4. Hypothyroidism due to acquired atrophy of thyroid Supplemented with no sx to suggest poor control - TSH + free T4  5. Osteopenia determined by x-ray Continue Calcium, vitamin D and weight bearing exercise Repeat DEXA in 3 years - VITAMIN D 25 Hydroxy (Vit-D Deficiency, Fractures)  6. Toe pain, chronic, left Recommend that she see Podiatry again.   Partially dictated using Editor, commissioning. Any errors are unintentional.  Halina Maidens, MD [facility]  12/19/2019

## 2019-12-20 LAB — COMPREHENSIVE METABOLIC PANEL
ALT: 20 IU/L (ref 0–32)
AST: 20 IU/L (ref 0–40)
Albumin/Globulin Ratio: 1.9 (ref 1.2–2.2)
Albumin: 4.6 g/dL (ref 3.8–4.8)
Alkaline Phosphatase: 78 IU/L (ref 48–121)
BUN/Creatinine Ratio: 12 (ref 12–28)
BUN: 10 mg/dL (ref 8–27)
Bilirubin Total: 0.3 mg/dL (ref 0.0–1.2)
CO2: 25 mmol/L (ref 20–29)
Calcium: 10.3 mg/dL (ref 8.7–10.3)
Chloride: 103 mmol/L (ref 96–106)
Creatinine, Ser: 0.84 mg/dL (ref 0.57–1.00)
GFR calc Af Amer: 84 mL/min/{1.73_m2} (ref >59)
GFR calc non Af Amer: 73 mL/min/{1.73_m2} (ref >59)
Globulin, Total: 2.4 g/dL (ref 1.5–4.5)
Glucose: 85 mg/dL (ref 65–99)
Potassium: 4.2 mmol/L (ref 3.5–5.2)
Sodium: 140 mmol/L (ref 134–144)
Total Protein: 7 g/dL (ref 6.0–8.5)

## 2019-12-20 LAB — CBC WITH DIFFERENTIAL/PLATELET
Basophils Absolute: 0.1 10*3/uL (ref 0.0–0.2)
Basos: 1 %
EOS (ABSOLUTE): 0.2 10*3/uL (ref 0.0–0.4)
Eos: 4 %
Hematocrit: 41.6 % (ref 34.0–46.6)
Hemoglobin: 14.1 g/dL (ref 11.1–15.9)
Immature Grans (Abs): 0 10*3/uL (ref 0.0–0.1)
Immature Granulocytes: 0 %
Lymphocytes Absolute: 1.7 10*3/uL (ref 0.7–3.1)
Lymphs: 29 %
MCH: 30.4 pg (ref 26.6–33.0)
MCHC: 33.9 g/dL (ref 31.5–35.7)
MCV: 90 fL (ref 79–97)
Monocytes Absolute: 0.4 10*3/uL (ref 0.1–0.9)
Monocytes: 7 %
Neutrophils Absolute: 3.4 10*3/uL (ref 1.4–7.0)
Neutrophils: 59 %
Platelets: 221 10*3/uL (ref 150–450)
RBC: 4.64 x10E6/uL (ref 3.77–5.28)
RDW: 12.2 % (ref 11.7–15.4)
WBC: 5.9 10*3/uL (ref 3.4–10.8)

## 2019-12-20 LAB — LIPID PANEL
Chol/HDL Ratio: 5.6 ratio — ABNORMAL HIGH (ref 0.0–4.4)
Cholesterol, Total: 248 mg/dL — ABNORMAL HIGH (ref 100–199)
HDL: 44 mg/dL (ref >39)
LDL Chol Calc (NIH): 164 mg/dL — ABNORMAL HIGH (ref 0–99)
Triglycerides: 216 mg/dL — ABNORMAL HIGH (ref 0–149)
VLDL Cholesterol Cal: 40 mg/dL (ref 5–40)

## 2019-12-20 LAB — TSH+FREE T4
Free T4: 1.19 ng/dL (ref 0.82–1.77)
TSH: 3.48 u[IU]/mL (ref 0.450–4.500)

## 2019-12-20 LAB — VITAMIN D 25 HYDROXY (VIT D DEFICIENCY, FRACTURES): Vit D, 25-Hydroxy: 34 ng/mL (ref 30.0–100.0)

## 2020-01-02 ENCOUNTER — Telehealth: Payer: Self-pay | Admitting: Internal Medicine

## 2020-01-02 NOTE — Telephone Encounter (Signed)
Mailed labs to patients home.   CM

## 2020-01-02 NOTE — Telephone Encounter (Signed)
Pt called in and was given the message from Dr. Army Melia dated 12/30/2019 regarding her lab results. She is requesting a copy be mailed to her.   "I don't use computers".

## 2020-01-16 DIAGNOSIS — L851 Acquired keratosis [keratoderma] palmaris et plantaris: Secondary | ICD-10-CM | POA: Diagnosis not present

## 2020-01-16 DIAGNOSIS — M2042 Other hammer toe(s) (acquired), left foot: Secondary | ICD-10-CM | POA: Diagnosis not present

## 2020-01-16 DIAGNOSIS — D485 Neoplasm of uncertain behavior of skin: Secondary | ICD-10-CM | POA: Diagnosis not present

## 2020-03-05 ENCOUNTER — Encounter: Payer: PRIVATE HEALTH INSURANCE | Admitting: Internal Medicine

## 2020-03-12 ENCOUNTER — Encounter: Payer: PRIVATE HEALTH INSURANCE | Admitting: Internal Medicine

## 2020-03-14 ENCOUNTER — Other Ambulatory Visit: Payer: Self-pay | Admitting: Internal Medicine

## 2020-03-14 DIAGNOSIS — E034 Atrophy of thyroid (acquired): Secondary | ICD-10-CM

## 2020-03-14 NOTE — Telephone Encounter (Signed)
Requested Prescriptions  Pending Prescriptions Disp Refills  . levothyroxine (SYNTHROID) 50 MCG tablet [Pharmacy Med Name: LEVOTHYROXINE 50 MCG TABLET] 90 tablet 4    Sig: TAKE 1 TABLET BY MOUTH EVERY DAY     Endocrinology:  Hypothyroid Agents Failed - 03/14/2020  8:46 AM      Failed - TSH needs to be rechecked within 3 months after an abnormal result. Refill until TSH is due.      Passed - TSH in normal range and within 360 days    TSH  Date Value Ref Range Status  12/19/2019 3.480 0.450 - 4.500 uIU/mL Final         Passed - Valid encounter within last 12 months    Recent Outpatient Visits          2 months ago Annual physical exam   [facility] Glean Hess, MD   1 year ago Annual physical exam   [facility] Glean Hess, MD   2 years ago Annual physical exam   [facility] Glean Hess, MD   2 years ago Screening for tuberculosis   [facility] Glean Hess, MD   3 years ago Annual physical exam   [facility] Glean Hess, MD

## 2020-03-15 ENCOUNTER — Encounter: Payer: PRIVATE HEALTH INSURANCE | Admitting: Internal Medicine

## 2020-03-19 ENCOUNTER — Encounter: Payer: PRIVATE HEALTH INSURANCE | Admitting: Internal Medicine

## 2020-03-29 DIAGNOSIS — R69 Illness, unspecified: Secondary | ICD-10-CM | POA: Diagnosis not present

## 2020-06-24 DIAGNOSIS — Z20822 Contact with and (suspected) exposure to covid-19: Secondary | ICD-10-CM | POA: Diagnosis not present

## 2020-06-24 DIAGNOSIS — Z03818 Encounter for observation for suspected exposure to other biological agents ruled out: Secondary | ICD-10-CM | POA: Diagnosis not present

## 2020-07-19 DIAGNOSIS — Z03818 Encounter for observation for suspected exposure to other biological agents ruled out: Secondary | ICD-10-CM | POA: Diagnosis not present

## 2020-07-19 DIAGNOSIS — Z20822 Contact with and (suspected) exposure to covid-19: Secondary | ICD-10-CM | POA: Diagnosis not present

## 2020-08-06 ENCOUNTER — Telehealth: Payer: Self-pay | Admitting: Internal Medicine

## 2020-08-06 NOTE — Telephone Encounter (Signed)
Patient is calling to request a referral to [facility] [place], [place]. [address] [phone] Patient is needing a referral because she needs a recall colonspy.  Patient is requesting a call when this has been placed.Cb- [phone]

## 2020-08-09 ENCOUNTER — Other Ambulatory Visit: Payer: Self-pay

## 2020-08-09 DIAGNOSIS — Z1211 Encounter for screening for malignant neoplasm of colon: Secondary | ICD-10-CM

## 2020-08-09 NOTE — Telephone Encounter (Signed)
Placed order for referral to [facility] for colonoscopy for patient. Called and left her VM to let her know it was done.

## 2020-10-11 ENCOUNTER — Telehealth: Payer: Self-pay | Admitting: Internal Medicine

## 2020-10-11 NOTE — Telephone Encounter (Signed)
Copied from [facility] [id number]. Topic: Medicare AWV >> Oct 11, 2020  3:46 PM Gerre Pebbles R wrote: Reason for CRM: Left message for patient to call back and schedule Medicare Annual Wellness Visit (AWV) either virtually or in office. Whichever the patients preference is.  No history of AWV; please schedule at anytime with [facility] Health Advisor.  This should be a 40 minute visit   AWV-I PER [place] AS OF 08/03/20

## 2020-10-15 DIAGNOSIS — Z1231 Encounter for screening mammogram for malignant neoplasm of breast: Secondary | ICD-10-CM | POA: Diagnosis not present

## 2020-11-09 DIAGNOSIS — I83893 Varicose veins of bilateral lower extremities with other complications: Secondary | ICD-10-CM | POA: Diagnosis not present

## 2020-11-26 ENCOUNTER — Other Ambulatory Visit: Payer: Self-pay

## 2020-11-26 ENCOUNTER — Ambulatory Visit
Admission: RE | Admit: 2020-11-26 | Discharge: 2020-11-26 | Disposition: A | Discharge status: Home / Self Care | Payer: Medicare HMO | Source: Ambulatory Visit | Attending: Internal Medicine | Admitting: Internal Medicine

## 2020-11-26 ENCOUNTER — Ambulatory Visit (INDEPENDENT_AMBULATORY_CARE_PROVIDER_SITE_OTHER): Payer: Medicare HMO | Admitting: Internal Medicine

## 2020-11-26 ENCOUNTER — Ambulatory Visit
Admission: RE | Admit: 2020-11-26 | Discharge: 2020-11-26 | Disposition: A | Discharge status: Home / Self Care | Payer: Medicare HMO | Attending: Internal Medicine | Admitting: Internal Medicine

## 2020-11-26 ENCOUNTER — Encounter: Payer: Self-pay | Admitting: Internal Medicine

## 2020-11-26 VITALS — BP 142/76 | HR 76 | Ht 60.0 in | Wt 133.0 lb

## 2020-11-26 DIAGNOSIS — E034 Atrophy of thyroid (acquired): Secondary | ICD-10-CM

## 2020-11-26 DIAGNOSIS — G8929 Other chronic pain: Secondary | ICD-10-CM | POA: Diagnosis not present

## 2020-11-26 DIAGNOSIS — Z0000 Encounter for general adult medical examination without abnormal findings: Secondary | ICD-10-CM

## 2020-11-26 DIAGNOSIS — E782 Mixed hyperlipidemia: Secondary | ICD-10-CM | POA: Diagnosis not present

## 2020-11-26 DIAGNOSIS — M25562 Pain in left knee: Secondary | ICD-10-CM

## 2020-11-26 DIAGNOSIS — Z1211 Encounter for screening for malignant neoplasm of colon: Secondary | ICD-10-CM | POA: Diagnosis not present

## 2020-11-26 NOTE — Progress Notes (Signed)
Date:  11/26/2020   Name:  Mafalda Mcginniss   DOB:  [birth year]   MRN:  [id number]   Chief Complaint: Annual Exam (Breast Exam. No pap.)  Klover Priestly is a 65 y.o. female who presents today for her Complete Annual Exam. She feels well. She reports exercising - walking 3 times weekly. She reports she is sleeping well. Breast complaints - none.  Mammogram: 10/2020 DEXA: 09/2019 osteopenia Pap smear: 2019 - discontinued Colonoscopy: 03/2016; scheduled for 01/21/21  Immunization History  Administered Date(s) Administered  . Influenza,inj,Quad PF,6+ Mos 05/14/2015, 06/16/2016, 02/21/2019  . Influenza-Unspecified 04/16/2017  . Moderna Sars-Covid-2 Vaccination 08/15/2019, 09/15/2019, 08/07/2020  . Pneumococcal Conjugate-13 12/19/2019  . Tdap 05/14/2015  . Zoster Recombinat (Shingrix) 08/22/2018, 12/21/2018  . Zoster, Live 05/19/2011    Thyroid Problem Presents for follow-up visit. Patient reports no anxiety, constipation, diarrhea, fatigue, palpitations or tremors. The symptoms have been stable.  Knee Pain  There was no injury mechanism. The pain is present in the left knee. The pain has been fluctuating since onset. Exacerbated by: getting up from sitting or kneeling.    Lab Results  Component Value Date   CREATININE 0.84 12/19/2019   BUN 10 12/19/2019   NA 140 12/19/2019   K 4.2 12/19/2019   CL 103 12/19/2019   CO2 25 12/19/2019   Lab Results  Component Value Date   CHOL 248 (H) 12/19/2019   HDL 44 12/19/2019   LDLCALC 164 (H) 12/19/2019   TRIG 216 (H) 12/19/2019   CHOLHDL 5.6 (H) 12/19/2019   Lab Results  Component Value Date   TSH 3.480 12/19/2019   No results found for: HGBA1C Lab Results  Component Value Date   WBC 5.9 12/19/2019   HGB 14.1 12/19/2019   HCT 41.6 12/19/2019   MCV 90 12/19/2019   PLT 221 12/19/2019   Lab Results  Component Value Date   ALT 20 12/19/2019   AST 20 12/19/2019   ALKPHOS 78 12/19/2019   BILITOT 0.3 12/19/2019   Last vitamin D Lab  Results  Component Value Date   VD25OH 34.0 12/19/2019     Review of Systems  Constitutional: Negative for chills, fatigue and fever.  HENT: Negative for congestion, hearing loss, tinnitus, trouble swallowing and voice change.   Eyes: Negative for visual disturbance.  Respiratory: Negative for cough, chest tightness, shortness of breath and wheezing.   Cardiovascular: Negative for chest pain, palpitations and leg swelling.  Gastrointestinal: Negative for abdominal pain, constipation, diarrhea and vomiting.  Endocrine: Negative for polydipsia and polyuria.  Genitourinary: Negative for dysuria, frequency, genital sores, vaginal bleeding and vaginal discharge.  Musculoskeletal: Negative for arthralgias, gait problem and joint swelling.  Skin: Negative for color change and rash.  Neurological: Negative for dizziness, tremors, light-headedness and headaches.  Hematological: Negative for adenopathy. Does not bruise/bleed easily.  Psychiatric/Behavioral: Negative for dysphoric mood and sleep disturbance. The patient is not nervous/anxious.     Patient Active Problem List   Diagnosis Date Noted  . Osteopenia determined by x-ray 09/11/2019  . Hypothyroidism due to acquired atrophy of thyroid 05/14/2015  . Constipation 05/14/2015  . Mixed hyperlipidemia 04/21/2015  . History of hay fever 04/21/2015  . History of colon polyps 04/21/2015  . Hot flash, menopausal 04/21/2015  . Plantar fasciitis 04/21/2015    Allergies  Allergen Reactions  . Codeine Anaphylaxis    Past Surgical History:  Procedure Laterality Date  . TUBAL LIGATION      Social History   Tobacco Use  .  Smoking status: Never Smoker  . Smokeless tobacco: Never Used  Substance Use Topics  . Alcohol use: Yes    Alcohol/week: 2.0 standard drinks    Types: 2 Standard drinks or equivalent per week  . Drug use: No     Medication list has been reviewed and updated.  Current Meds  Medication Sig  . Apoaequorin  (PREVAGEN PO) Take 1 tablet by mouth daily.  . Calcium 200 MG TABS Take 650 mg by mouth daily. One in AM one in PM  . Cholecalciferol 1000 UNITS capsule Take by mouth.  . Cinnamon 500 MG capsule Take 500 mg by mouth daily.  . diphenhydrAMINE HCl (ALLERGY MED PO) Take 1 tablet by mouth daily.  Marland Kitchen levothyroxine (SYNTHROID) 50 MCG tablet TAKE 1 TABLET BY MOUTH EVERY DAY  . loratadine (CLARITIN) 10 MG tablet Take 1 tablet by mouth daily.  . Multiple Vitamins-Minerals (WOMENS MULTIVITAMIN PLUS) TABS Take 1 tablet by mouth daily as needed.  . Omega-3 Fatty Acids (FISH OIL) 1000 MG CAPS   . vitamin C (ASCORBIC ACID) 500 MG tablet Take 500 mg by mouth daily.    PHQ 2/9 Scores 11/26/2020 12/19/2019 02/21/2019 08/10/2017  PHQ - 2 Score 0 0 0 0  PHQ- 9 Score 0 0 - 0    GAD 7 : Generalized Anxiety Score 11/26/2020 12/19/2019  Nervous, Anxious, on Edge 0 0  Control/stop worrying 0 0  Worry too much - different things 0 0  Trouble relaxing 0 0  Restless 0 0  Easily annoyed or irritable 0 0  Afraid - awful might happen 0 0  Total GAD 7 Score 0 0  Anxiety Difficulty Not difficult at all Not difficult at all    BP Readings from Last 3 Encounters:  11/26/20 (!) 142/76  12/19/19 118/76  02/21/19 124/78    Physical Exam Vitals and nursing note reviewed.  Constitutional:      General: She is not in acute distress.    Appearance: She is well-developed.  HENT:     Head: Normocephalic and atraumatic.     Right Ear: Tympanic membrane and ear canal normal.     Left Ear: Tympanic membrane and ear canal normal.     Nose:     Right Sinus: No maxillary sinus tenderness.     Left Sinus: No maxillary sinus tenderness.  Eyes:     General: No scleral icterus.       Right eye: No discharge.        Left eye: No discharge.     Conjunctiva/sclera: Conjunctivae normal.  Neck:     Thyroid: No thyromegaly.     Vascular: No carotid bruit.  Cardiovascular:     Rate and Rhythm: Normal rate and regular rhythm.      Pulses: Normal pulses.     Heart sounds: Normal heart sounds.  Pulmonary:     Effort: Pulmonary effort is normal. No respiratory distress.     Breath sounds: No wheezing.  Chest:  Breasts:     Right: No mass, nipple discharge, skin change or tenderness.     Left: No mass, nipple discharge, skin change or tenderness.    Abdominal:     General: Bowel sounds are normal.     Palpations: Abdomen is soft.     Tenderness: There is no abdominal tenderness.  Musculoskeletal:     Cervical back: Normal range of motion. No erythema.     Right knee: No effusion, erythema or ecchymosis.  Left knee: Bony tenderness present. No effusion, erythema or ecchymosis.     Right lower leg: No edema.     Left lower leg: No edema.  Lymphadenopathy:     Cervical: No cervical adenopathy.  Skin:    General: Skin is warm and dry.     Findings: No rash.  Neurological:     Mental Status: She is alert and oriented to person, place, and time.     Cranial Nerves: No cranial nerve deficit.     Sensory: No sensory deficit.     Deep Tendon Reflexes: Reflexes are normal and symmetric.  Psychiatric:        Attention and Perception: Attention normal.        Mood and Affect: Mood normal.     Wt Readings from Last 3 Encounters:  11/26/20 133 lb (60.3 kg)  12/19/19 138 lb (62.6 kg)  02/21/19 141 lb (64 kg)    BP (!) 142/76   Pulse 76   Ht 5' (1.524 m)   Wt 133 lb (60.3 kg)   SpO2 97%   BMI 25.97 kg/m   Assessment and Plan: 1. Annual physical exam Normal exam; continue healthy diet, exercise Immunizations up to date - consider Shingrix Mammogram done in April - CBC with Differential/Platelet - Comprehensive metabolic panel  2. Colon cancer screening Colonoscopy scheduled for July at [facility]  3. Hypothyroidism due to acquired atrophy of thyroid supplemented - TSH + free T4  4. Mixed hyperlipidemia Check labs and advise - Lipid panel  5. Chronic pain of left knee Will get imaging and  consider SM referral - DG Knee Complete 4 Views Left; Future   Partially dictated using Editor, commissioning. Any errors are unintentional.  Halina Maidens, MD [facility]  11/26/2020

## 2020-11-26 NOTE — Patient Instructions (Addendum)
Goal blood pressure is less than 135/85.  Closer to 130/80 is best.  Call or make a follow up BP is not controlled.

## 2020-11-27 LAB — CBC WITH DIFFERENTIAL/PLATELET
Basophils Absolute: 0.1 10*3/uL (ref 0.0–0.2)
Basos: 1 %
EOS (ABSOLUTE): 0.2 10*3/uL (ref 0.0–0.4)
Eos: 3 %
Hematocrit: 40.6 % (ref 34.0–46.6)
Hemoglobin: 14 g/dL (ref 11.1–15.9)
Immature Grans (Abs): 0 10*3/uL (ref 0.0–0.1)
Immature Granulocytes: 0 %
Lymphocytes Absolute: 1.5 10*3/uL (ref 0.7–3.1)
Lymphs: 28 %
MCH: 31.8 pg (ref 26.6–33.0)
MCHC: 34.5 g/dL (ref 31.5–35.7)
MCV: 92 fL (ref 79–97)
Monocytes Absolute: 0.4 10*3/uL (ref 0.1–0.9)
Monocytes: 8 %
Neutrophils Absolute: 3.2 10*3/uL (ref 1.4–7.0)
Neutrophils: 60 %
Platelets: 200 10*3/uL (ref 150–450)
RBC: 4.4 x10E6/uL (ref 3.77–5.28)
RDW: 12.3 % (ref 11.7–15.4)
WBC: 5.4 10*3/uL (ref 3.4–10.8)

## 2020-11-27 LAB — COMPREHENSIVE METABOLIC PANEL
ALT: 18 IU/L (ref 0–32)
AST: 16 IU/L (ref 0–40)
Albumin/Globulin Ratio: 1.7 (ref 1.2–2.2)
Albumin: 4.5 g/dL (ref 3.8–4.8)
Alkaline Phosphatase: 71 IU/L (ref 44–121)
BUN/Creatinine Ratio: 18 (ref 12–28)
BUN: 15 mg/dL (ref 8–27)
Bilirubin Total: 0.5 mg/dL (ref 0.0–1.2)
CO2: 26 mmol/L (ref 20–29)
Calcium: 10.2 mg/dL (ref 8.7–10.3)
Chloride: 102 mmol/L (ref 96–106)
Creatinine, Ser: 0.84 mg/dL (ref 0.57–1.00)
Globulin, Total: 2.6 g/dL (ref 1.5–4.5)
Glucose: 102 mg/dL — ABNORMAL HIGH (ref 65–99)
Potassium: 4.1 mmol/L (ref 3.5–5.2)
Sodium: 141 mmol/L (ref 134–144)
Total Protein: 7.1 g/dL (ref 6.0–8.5)
eGFR: 77 mL/min/{1.73_m2} (ref >59)

## 2020-11-27 LAB — LIPID PANEL
Chol/HDL Ratio: 4.4 ratio (ref 0.0–4.4)
Cholesterol, Total: 246 mg/dL — ABNORMAL HIGH (ref 100–199)
HDL: 56 mg/dL (ref >39)
LDL Chol Calc (NIH): 169 mg/dL — ABNORMAL HIGH (ref 0–99)
Triglycerides: 117 mg/dL (ref 0–149)
VLDL Cholesterol Cal: 21 mg/dL (ref 5–40)

## 2020-11-27 LAB — TSH+FREE T4
Free T4: 1.13 ng/dL (ref 0.82–1.77)
TSH: 2.53 u[IU]/mL (ref 0.450–4.500)

## 2020-12-10 ENCOUNTER — Ambulatory Visit (INDEPENDENT_AMBULATORY_CARE_PROVIDER_SITE_OTHER): Payer: Medicare HMO | Admitting: Family Medicine

## 2020-12-10 ENCOUNTER — Encounter: Payer: Self-pay | Admitting: Family Medicine

## 2020-12-10 ENCOUNTER — Other Ambulatory Visit: Payer: Self-pay

## 2020-12-10 ENCOUNTER — Encounter: Payer: Medicare HMO | Admitting: Family Medicine

## 2020-12-10 VITALS — BP 118/70 | HR 77 | Temp 97.8°F | Ht 60.0 in | Wt 132.6 lb

## 2020-12-10 DIAGNOSIS — M1712 Unilateral primary osteoarthritis, left knee: Secondary | ICD-10-CM

## 2020-12-10 MED ORDER — PENNSAID 2 % EX SOLN: 2.0000 | Freq: Two times a day (BID) | CUTANEOUS | 1 refills | Status: DC | PRN

## 2020-12-10 NOTE — Progress Notes (Signed)
Primary Care / Sports Medicine Office Visit  Patient Information:  Patient ID: Evelyn Daniels, female DOB: 03-Oct-1955 Age: 65 y.o. MRN: [id number]   Evelyn Daniels is a pleasant 65 y.o. female presenting with the following:  Chief Complaint  Patient presents with   New Patient (Initial Visit)   Knee Pain    Left; had a fall tripping over a gate on Saturday, 12/04/20, and hit knee on the wall; bumps around the knee for years per patient, but recently mentioned it to Dr. Army Melia; 3/10 pain in office    Review of Systems pertinent details above   Patient Active Problem List   Diagnosis Date Noted   Primary osteoarthritis of left knee 12/10/2020   Osteopenia determined by x-ray 09/11/2019   Hypothyroidism due to acquired atrophy of thyroid 05/14/2015   Constipation 05/14/2015   Mixed hyperlipidemia 04/21/2015   History of hay fever 04/21/2015   History of colon polyps 04/21/2015   Hot flash, menopausal 04/21/2015   Plantar fasciitis 04/21/2015   Past Medical History:  Diagnosis Date   Allergy    Thyroid disease    Outpatient Encounter Medications as of 12/10/2020  Medication Sig   Apoaequorin (PREVAGEN PO) Take 1 tablet by mouth daily.   CALCIUM PO Take 650 mg by mouth in the morning and at bedtime.   Cholecalciferol 1000 UNITS capsule Take 1,000 Units by mouth daily.   Cinnamon 500 MG capsule Take 500 mg by mouth daily.   Diclofenac Sodium (PENNSAID) 2 % SOLN Apply 2 sprays topically 2 (two) times daily as needed.   levothyroxine (SYNTHROID) 50 MCG tablet TAKE 1 TABLET BY MOUTH EVERY DAY   loratadine (CLARITIN) 10 MG tablet Take 1 tablet by mouth daily.   Multiple Vitamins-Minerals (WOMENS MULTIVITAMIN PLUS) TABS Take 1 tablet by mouth daily as needed.   Omega-3 Fatty Acids (FISH OIL) 1000 MG CAPS Take 1,000 mg by mouth daily.   vitamin C (ASCORBIC ACID) 500 MG tablet Take 500 mg by mouth daily.   [DISCONTINUED] diphenhydrAMINE HCl (ALLERGY MED PO) Take 1 tablet by mouth daily.    No facility-administered encounter medications on file as of 12/10/2020.   Past Surgical History:  Procedure Laterality Date   TUBAL LIGATION      Vitals:   12/10/20 0935  BP: 118/70  Pulse: 77  Temp: 97.8 F (36.6 C)  SpO2: 98%   Vitals:   12/10/20 0935  Weight: 132 lb 9.6 oz (60.1 kg)  Height: 5' (1.524 m)   Body mass index is 25.9 kg/m.  DG Knee Complete 4 Views Left  Result Date: 11/28/2020 CLINICAL DATA:  Chronic LEFT knee pain for awhile, pain adjacent to lateral margin of mid patella, no recent injury EXAM: LEFT KNEE - COMPLETE 4+ VIEW COMPARISON:  None FINDINGS: Osseous demineralization. Mild joint space narrowing. No fracture, dislocation, or bone destruction. No joint effusion. IMPRESSION: Mild degenerative changes and osseous demineralization LEFT knee. No acute abnormalities. Electronically Signed   By: Lavonia Dana M.D.   On: 11/28/2020 12:08     Independent interpretation of notes and tests performed by another provider:   Independent interpretation of left knee x-ray dated 11/26/2020 reveals moderate medial tibiofemoral narrowing, well-defined opacity that is noted to be anterior to the tibia on lateral view, sunrise view reveals lateral trochlear osteophyte, subtle lateral patellar osteophyte, no acute osseous process identified  Procedures performed:   None  Pertinent History, Exam, Impression, and Recommendations:   Primary osteoarthritis of left knee Patient with  longstanding left knee pain that is actively symptomatic, ongoing for roughly 2 years, denies any specific trauma or injury at onset 2 years prior.  Pain is localized anteromedially without radiation, offers sporadically and is self-limited.  She has noted, upon further questioning, that deep bends/squatting does tend to aggravate the knee.  Denies any swelling, has had episodes of near buckling, distant history of mechanical catching but none as of late.  Physical exam reveals no effusion, range  of motion 0-135 degrees, pain during maximal flexion, focal tenderness at the medial patellar facet, maximal at the medial joint line, trace/secondary to the lateral joint line, equivocal medial McMurray, otherwise no laxity during AP and varus/valgus stressing.  Her x-rays reveal moderate medial greater than lateral tibiofemoral and mild patellofemoral osteoarthritis.  I have reviewed the same with the patient as well as possible treatment strategies moving forward.  She is amenable to consideration of viscosupplementation and/or PRP, in the interim I have advised topical Pennsaid on a as needed basis.  Once symptoms adequately controlled she would benefit from home-based rehab to strengthen/stabilize the knee.    Orders & Medications Meds ordered this encounter  Medications   Diclofenac Sodium (PENNSAID) 2 % SOLN    Sig: Apply 2 sprays topically 2 (two) times daily as needed.    Dispense:  112 g    Refill:  1    No orders of the defined types were placed in this encounter.    No follow-ups on file.     [name], MD   Primary Care Sports Medicine [place]

## 2020-12-10 NOTE — Assessment & Plan Note (Signed)
Patient with longstanding left knee pain that is actively symptomatic, ongoing for roughly 2 years, denies any specific trauma or injury at onset 2 years prior.  Pain is localized anteromedially without radiation, offers sporadically and is self-limited.  She has noted, upon further questioning, that deep bends/squatting does tend to aggravate the knee.  Denies any swelling, has had episodes of near buckling, distant history of mechanical catching but none as of late.  Physical exam reveals no effusion, range of motion 0-135 degrees, pain during maximal flexion, focal tenderness at the medial patellar facet, maximal at the medial joint line, trace/secondary to the lateral joint line, equivocal medial McMurray, otherwise no laxity during AP and varus/valgus stressing.  Her x-rays reveal moderate medial greater than lateral tibiofemoral and mild patellofemoral osteoarthritis.  I have reviewed the same with the patient as well as possible treatment strategies moving forward.  She is amenable to consideration of viscosupplementation and/or PRP, in the interim I have advised topical Pennsaid on a as needed basis.  Once symptoms adequately controlled she would benefit from home-based rehab to strengthen/stabilize the knee.

## 2020-12-10 NOTE — Patient Instructions (Signed)
-   Our office will reach out to your insurance company regarding viscosupplementation "gel injection " - Can look into platelet rich plasma "PRP " for knee osteoarthritis - Can use topical anti-inflammatory Pennsaid up to twice a day on clean and dry skin for knee pain - We will discuss next steps once we have additional information - Contact us for any questions between now and then

## 2020-12-28 ENCOUNTER — Telehealth: Payer: Self-pay | Admitting: Internal Medicine

## 2020-12-28 NOTE — Telephone Encounter (Signed)
Copied from [place] [phone]. Topic: Medicare AWV >> Dec 28, 2020  3:03 PM Cher Nakai R wrote: Reason for CRM:   Left message for patient to call back and schedule Medicare Annual Wellness Visit (AWV) in office.   If unable to come into the office for AWV,  please offer to do virtually or by telephone.  No hx of AWV eligible for AWVI as of 08/03/2020  Please schedule at anytime with [facility] Health Advisor.      40 Minutes appointment   Any questions, please call me at [phone]

## 2021-01-21 DIAGNOSIS — Z86010 Personal history of colonic polyps: Secondary | ICD-10-CM | POA: Diagnosis not present

## 2021-01-21 DIAGNOSIS — K5730 Diverticulosis of large intestine without perforation or abscess without bleeding: Secondary | ICD-10-CM | POA: Diagnosis not present

## 2021-01-21 DIAGNOSIS — Z1211 Encounter for screening for malignant neoplasm of colon: Secondary | ICD-10-CM | POA: Diagnosis not present

## 2021-01-27 ENCOUNTER — Telehealth: Payer: Self-pay

## 2021-01-27 NOTE — Telephone Encounter (Signed)
Called patient and left her detailed msg informing her of this.

## 2021-01-27 NOTE — Telephone Encounter (Signed)
Copied from [place] [phone]. Topic: General - Other >> Jan 27, 2021  8:28 AM Leward Quan A wrote: Reason for CRM: Patient called in to inform Dr Army Melia that she found a dermatologist an they need a referral please ASAP [facility] on [address] [place],  [place] [address]  Dr Shelia Media  Ph# [phone]. Patient  Ph# [phone]

## 2021-03-02 ENCOUNTER — Telehealth: Payer: Self-pay

## 2021-03-02 NOTE — Telephone Encounter (Signed)
Copied from [place] [phone]. Topic: Referral - Request for Referral >> Mar 02, 2021 11:53 AM Alanda Slim E wrote: Has patient seen PCP for this complaint? yes *If NO, is insurance requiring patient see PCP for this issue before PCP can refer them? Referral for which specialty: dermatology  Preferred provider/office: [facility] on [address] / phone# [phone]( pt wants to see Dr. Annie Sable) Reason for referral: moles on back / Dr. Army Melia wants pt to have these checked / office needs referral to see pt

## 2021-03-03 NOTE — Telephone Encounter (Signed)
Please review.  KP

## 2021-03-04 ENCOUNTER — Other Ambulatory Visit: Payer: Self-pay

## 2021-03-04 DIAGNOSIS — D229 Melanocytic nevi, unspecified: Secondary | ICD-10-CM

## 2021-03-04 NOTE — Telephone Encounter (Signed)
Referral placed.  KP 

## 2021-03-08 ENCOUNTER — Telehealth: Payer: Self-pay | Admitting: Internal Medicine

## 2021-03-08 NOTE — Telephone Encounter (Signed)
Copied from [place] [phone]. Topic: Medicare AWV >> Mar 08, 2021  4:59 PM Cher Nakai R wrote: Reason for CRM:  Left message for patient to call back and schedule Medicare Annual Wellness Visit (AWV) in office.   If unable to come into the office for AWV,  please offer to do virtually or by telephone.  No hx of AWV eligible for AWVI as of 08/03/2020  Please schedule at anytime with [facility] Health Advisor.      40 Minutes appointment   Any questions, please call me at [phone]

## 2021-04-08 ENCOUNTER — Ambulatory Visit: Payer: Medicare HMO | Admitting: Internal Medicine

## 2021-04-22 ENCOUNTER — Other Ambulatory Visit: Payer: Self-pay

## 2021-04-22 ENCOUNTER — Ambulatory Visit (INDEPENDENT_AMBULATORY_CARE_PROVIDER_SITE_OTHER): Payer: Medicare HMO | Admitting: Internal Medicine

## 2021-04-22 ENCOUNTER — Encounter: Payer: Self-pay | Admitting: Internal Medicine

## 2021-04-22 VITALS — BP 132/68 | HR 74 | Ht 60.0 in | Wt 137.6 lb

## 2021-04-22 DIAGNOSIS — M65331 Trigger finger, right middle finger: Secondary | ICD-10-CM

## 2021-04-22 NOTE — Progress Notes (Signed)
Date:  04/22/2021   Name:  Evelyn Daniels   DOB:  10-Jan-1956   MRN:  [id number]   Chief Complaint: Hand Pain (Right middle finger x 1 month. Painful, and locking. )  Hand Pain  The incident occurred more than 1 week ago. There was no injury mechanism. The pain is present in the right hand. The quality of the pain is described as aching and cramping (and locking of the middle finger). The symptoms are aggravated by movement.   Lab Results  Component Value Date   CREATININE 0.84 11/26/2020   BUN 15 11/26/2020   NA 141 11/26/2020   K 4.1 11/26/2020   CL 102 11/26/2020   CO2 26 11/26/2020   Lab Results  Component Value Date   CHOL 246 (H) 11/26/2020   HDL 56 11/26/2020   LDLCALC 169 (H) 11/26/2020   TRIG 117 11/26/2020   CHOLHDL 4.4 11/26/2020   Lab Results  Component Value Date   TSH 2.530 11/26/2020   No results found for: HGBA1C Lab Results  Component Value Date   WBC 5.4 11/26/2020   HGB 14.0 11/26/2020   HCT 40.6 11/26/2020   MCV 92 11/26/2020   PLT 200 11/26/2020   Lab Results  Component Value Date   ALT 18 11/26/2020   AST 16 11/26/2020   ALKPHOS 71 11/26/2020   BILITOT 0.5 11/26/2020     Review of Systems  Constitutional:  Negative for chills, fatigue and fever.  Musculoskeletal:  Positive for arthralgias (right hand pain and finger triggering).   Patient Active Problem List   Diagnosis Date Noted   Primary osteoarthritis of left knee 12/10/2020   Osteopenia determined by x-ray 09/11/2019   Hypothyroidism due to acquired atrophy of thyroid 05/14/2015   Constipation 05/14/2015   Mixed hyperlipidemia 04/21/2015   History of hay fever 04/21/2015   History of colon polyps 04/21/2015   Hot flash, menopausal 04/21/2015   Plantar fasciitis 04/21/2015    Allergies  Allergen Reactions   Codeine Anaphylaxis    Past Surgical History:  Procedure Laterality Date   TUBAL LIGATION      Social History   Tobacco Use   Smoking status: Never    Smokeless tobacco: Never  Vaping Use   Vaping Use: Never used  Substance Use Topics   Alcohol use: Not Currently   Drug use: Never     Medication list has been reviewed and updated.  Current Meds  Medication Sig   Apoaequorin (PREVAGEN PO) Take 1 tablet by mouth daily.   CALCIUM PO Take 650 mg by mouth in the morning and at bedtime.   Cholecalciferol 1000 UNITS capsule Take 1,000 Units by mouth daily.   Cinnamon 500 MG capsule Take 500 mg by mouth daily.   Diclofenac Sodium (PENNSAID) 2 % SOLN Apply 2 sprays topically 2 (two) times daily as needed.   levothyroxine (SYNTHROID) 50 MCG tablet TAKE 1 TABLET BY MOUTH EVERY DAY   loratadine (CLARITIN) 10 MG tablet Take 1 tablet by mouth daily.   Multiple Vitamins-Minerals (WOMENS MULTIVITAMIN PLUS) TABS Take 1 tablet by mouth daily as needed.   Omega-3 Fatty Acids (FISH OIL) 1000 MG CAPS Take 1,000 mg by mouth daily.   vitamin C (ASCORBIC ACID) 500 MG tablet Take 500 mg by mouth daily.    PHQ 2/9 Scores 12/10/2020 11/26/2020 12/19/2019 02/21/2019  PHQ - 2 Score 0 0 0 0  PHQ- 9 Score 0 0 0 -    GAD 7 : Generalized Anxiety  Score 12/10/2020 11/26/2020 12/19/2019  Nervous, Anxious, on Edge 0 0 0  Control/stop worrying 0 0 0  Worry too much - different things 0 0 0  Trouble relaxing 0 0 0  Restless 0 0 0  Easily annoyed or irritable 0 0 0  Afraid - awful might happen 0 0 0  Total GAD 7 Score 0 0 0  Anxiety Difficulty Not difficult at all Not difficult at all Not difficult at all    BP Readings from Last 3 Encounters:  04/22/21 132/68  12/10/20 118/70  11/26/20 (!) 142/76    Physical Exam Vitals and nursing note reviewed.  Constitutional:      General: She is not in acute distress.    Appearance: She is well-developed.  HENT:     Head: Normocephalic and atraumatic.  Pulmonary:     Effort: Pulmonary effort is normal. No respiratory distress.  Musculoskeletal:     Right hand: Tenderness (over the base of the middle finger, mild  triggering noted, no definite nodule) present.  Skin:    General: Skin is warm and dry.     Findings: No rash.  Neurological:     Mental Status: She is alert and oriented to person, place, and time.  Psychiatric:        Mood and Affect: Mood normal.        Behavior: Behavior normal.    Wt Readings from Last 3 Encounters:  04/22/21 137 lb 9.6 oz (62.4 kg)  12/10/20 132 lb 9.6 oz (60.1 kg)  11/26/20 133 lb (60.3 kg)    BP 132/68   Pulse 74   Ht 5' (1.524 m)   Wt 137 lb 9.6 oz (62.4 kg)   SpO2 97%   BMI 26.87 kg/m   Assessment and Plan: 1. Trigger middle finger of right hand Recommend referral to SM or Ortho if desired It is also acceptable to wait but symptoms unlikely to improve without treatment   Partially dictated using Dragon software. Any errors are unintentional.  Halina Maidens, MD [facility]  04/22/2021

## 2021-05-06 ENCOUNTER — Encounter: Payer: Self-pay | Admitting: Family Medicine

## 2021-05-06 ENCOUNTER — Inpatient Hospital Stay (INDEPENDENT_AMBULATORY_CARE_PROVIDER_SITE_OTHER): Payer: Medicare HMO | Admitting: Radiology

## 2021-05-06 ENCOUNTER — Ambulatory Visit (INDEPENDENT_AMBULATORY_CARE_PROVIDER_SITE_OTHER): Payer: Medicare HMO | Admitting: Family Medicine

## 2021-05-06 ENCOUNTER — Other Ambulatory Visit: Payer: Self-pay

## 2021-05-06 VITALS — BP 112/76 | HR 84 | Ht 60.0 in | Wt 136.0 lb

## 2021-05-06 DIAGNOSIS — M65841 Other synovitis and tenosynovitis, right hand: Secondary | ICD-10-CM | POA: Insufficient documentation

## 2021-05-06 MED ORDER — TRIAMCINOLONE ACETONIDE 40 MG/ML IJ SUSP
20.0000 mg | Freq: Once | INTRAMUSCULAR | Status: AC
  Administered 2021-05-06: 20 mg

## 2021-05-06 MED ORDER — MELOXICAM 15 MG PO TABS: 15.0000 mg | ORAL_TABLET | Freq: Every day | ORAL | 0 refills | Status: DC | PRN

## 2021-05-06 NOTE — Progress Notes (Signed)
Primary Care / Sports Medicine Office Visit  Patient Information:  Patient ID: Evelyn Daniels, female DOB: September 21, 1955 Age: 65 y.o. MRN: [id number]   Evelyn Daniels is a pleasant 65 y.o. 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; right-handedness; 3/10 pain    Review of Systems pertinent details above   Patient Active Problem List   Diagnosis Date Noted   Stenosing tenosynovitis of finger of right hand 05/06/2021   Trigger middle finger of right hand 04/22/2021   Primary osteoarthritis of left knee 12/10/2020   Osteopenia determined by x-ray 09/11/2019   Hypothyroidism due to acquired atrophy of thyroid 05/14/2015   Constipation 05/14/2015   Mixed hyperlipidemia 04/21/2015   History of hay fever 04/21/2015   History of colon polyps 04/21/2015   Hot flash, menopausal 04/21/2015   Plantar fasciitis 04/21/2015   Past Medical History:  Diagnosis Date   Allergy    Thyroid disease    Outpatient Encounter Medications as of 05/06/2021  Medication Sig   Apoaequorin (PREVAGEN PO) Take 1 tablet by mouth daily.   CALCIUM PO Take 650 mg by mouth in the morning and at bedtime.   Cholecalciferol 1000 UNITS capsule Take 1,000 Units by mouth daily.   Cinnamon 500 MG capsule Take 500 mg by mouth daily.   levothyroxine (SYNTHROID) 50 MCG tablet TAKE 1 TABLET BY MOUTH EVERY DAY   loratadine (CLARITIN) 10 MG tablet Take 1 tablet by mouth daily.   meloxicam (MOBIC) 15 MG tablet Take 1 tablet (15 mg total) by mouth daily as needed for pain.   Multiple Vitamins-Minerals (WOMENS MULTIVITAMIN PLUS) TABS Take 1 tablet by mouth daily as needed.   Omega-3 Fatty Acids (FISH OIL) 1000 MG CAPS Take 1,000 mg by mouth daily.   vitamin C (ASCORBIC ACID) 500 MG tablet Take 500 mg by mouth daily.   [DISCONTINUED] Diclofenac Sodium (PENNSAID) 2 % SOLN Apply 2  sprays topically 2 (two) times daily as needed.   [EXPIRED] triamcinolone acetonide (KENALOG-40) injection 20 mg    No facility-administered encounter medications on file as of 05/06/2021.   Past Surgical History:  Procedure Laterality Date   TUBAL LIGATION      Vitals:   05/06/21 1446  BP: 112/76  Pulse: 84  SpO2: 97%   Vitals:   05/06/21 1446  Weight: 136 lb (61.7 kg)  Height: 5' (1.524 m)   Body mass index is 26.56 kg/m.  No results found.   Independent interpretation of notes and tests performed by another provider:   None  Procedures performed:   Procedure:  Injection of third digit flexor tendon under ultrasound guidance. Ultrasound guidance utilized for out of plane approach to peritendinous region of the third digit flexor tendon, hypoechoic response of injectate noted, subtle tendon hypertrophy noted at the volar MCP Samsung HS60 device utilized with permanent recording / reporting. Consent obtained and verified. Skin prepped in a sterile fashion. Ethyl chloride spray for topical local analgesia.  Completed without difficulty and tolerated well. Medication: triamcinolone acetonide 40 mg/mL suspension for injection 0.5 mL total and 0.5 mL lidocaine 1% without epinephrine utilized for needle placement anesthetic Advised to contact for fevers/chills, erythema, induration, drainage, or persistent bleeding.   Pertinent History, Exam, Impression, and Recommendations:   Stenosing tenosynovitis of finger of right hand Patient presenting with roughly 3-week history of atraumatic  right third digit locking associated with volar pain.  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  She denies any similar episodes in the past, has been treating this with rest alone.  Examination reveals focal tenderness of the volar third MCP/distal palmar crease region, neurovascularly intact, passive flexion/extension  does elicit triggering.  Reviewed various treatment strategies and patient elected proceed with ultrasound-guided trigger finger injection of the right third digit.  Post care reviewed and meloxicam written for daily dosing until symptoms respond to cortisone.  I did provide home exercises for patient to commence once symptoms respond to cortisone.  She can follow-up on as-needed basis for this issue.   Orders & Medications Meds ordered this encounter  Medications   meloxicam (MOBIC) 15 MG tablet    Sig: Take 1 tablet (15 mg total) by mouth daily as needed for pain.    Dispense:  14 tablet    Refill:  0   triamcinolone acetonide (KENALOG-40) injection 20 mg   Orders Placed This Encounter  Procedures   [place] LIMITED JOINT SPACE STRUCTURES UP RIGHT     Return if symptoms worsen or fail to improve.     [name], MD   Primary Care Sports Medicine [place]

## 2021-05-06 NOTE — Assessment & Plan Note (Addendum)
Patient presenting with roughly 3-week history of atraumatic right third digit locking associated with volar pain.  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  She denies any similar episodes in the past, has been treating this with rest alone.  Examination reveals focal tenderness of the volar third MCP/distal palmar crease region, neurovascularly intact, passive flexion/extension does elicit triggering.  Reviewed various treatment strategies and patient elected proceed with ultrasound-guided trigger finger injection of the right third digit.  Post care reviewed and meloxicam written for daily dosing until symptoms respond to cortisone.  I did provide home exercises for patient to commence once symptoms respond to cortisone.  She can follow-up on as-needed basis for this issue.

## 2021-05-06 NOTE — Patient Instructions (Addendum)
You have just been given a cortisone injection to reduce pain and inflammation. After the injection you may notice immediate relief of pain as a result of the Lidocaine. It is important to rest the area of the injection for 24 to 48 hours after the injection. There is a possibility of some temporary increased discomfort and swelling for up to 72 hours until the cortisone begins to work. If you do have pain, simply rest the joint and use ice. If you can tolerate over the counter medications, you can try Tylenol, Aleve, or Advil for added relief per package instructions. - Dose meloxicam once daily as-needed until symptoms respond to cortisone - Once symptoms respond to cortisone, start and progress home exercises with information provided - Contact for questions and follow-up as needed

## 2021-06-06 ENCOUNTER — Other Ambulatory Visit: Payer: Self-pay | Admitting: Internal Medicine

## 2021-06-06 DIAGNOSIS — E034 Atrophy of thyroid (acquired): Secondary | ICD-10-CM

## 2021-06-06 NOTE — Telephone Encounter (Signed)
Requested Prescriptions  Pending Prescriptions Disp Refills  . levothyroxine (SYNTHROID) 50 MCG tablet [Pharmacy Med Name: LEVOTHYROXINE 50 MCG TABLET] 90 tablet 1    Sig: TAKE 1 TABLET BY MOUTH EVERY DAY     Endocrinology:  Hypothyroid Agents Failed - 06/06/2021  8:44 AM      Failed - TSH needs to be rechecked within 3 months after an abnormal result. Refill until TSH is due.      Passed - TSH in normal range and within 360 days    TSH  Date Value Ref Range Status  11/26/2020 2.530 0.450 - 4.500 uIU/mL Final         Passed - Valid encounter within last 12 months    Recent Outpatient Visits          1 month ago Stenosing tenosynovitis of finger of right hand   [facility] [name], MD   1 month ago Trigger middle finger of right hand   [place], [place]   5 months ago Primary osteoarthritis of left knee   [facility] [name], MD   6 months ago Annual physical exam   [facility] Glean Hess, MD   1 year ago Annual physical exam   [facility] Glean Hess, MD      Future Appointments            In 5 months Army Melia Jesse Sans, MD [facility], [facility]

## 2021-06-10 DIAGNOSIS — T7840XD Allergy, unspecified, subsequent encounter: Secondary | ICD-10-CM | POA: Diagnosis not present

## 2021-06-10 DIAGNOSIS — Z809 Family history of malignant neoplasm, unspecified: Secondary | ICD-10-CM | POA: Diagnosis not present

## 2021-06-10 DIAGNOSIS — R030 Elevated blood-pressure reading, without diagnosis of hypertension: Secondary | ICD-10-CM | POA: Diagnosis not present

## 2021-06-10 DIAGNOSIS — E039 Hypothyroidism, unspecified: Secondary | ICD-10-CM | POA: Diagnosis not present

## 2021-06-10 DIAGNOSIS — R69 Illness, unspecified: Secondary | ICD-10-CM | POA: Diagnosis not present

## 2021-06-10 DIAGNOSIS — Z833 Family history of diabetes mellitus: Secondary | ICD-10-CM | POA: Diagnosis not present

## 2021-06-10 DIAGNOSIS — Z7722 Contact with and (suspected) exposure to environmental tobacco smoke (acute) (chronic): Secondary | ICD-10-CM | POA: Diagnosis not present

## 2021-07-21 ENCOUNTER — Telehealth: Payer: Self-pay | Admitting: Internal Medicine

## 2021-07-21 NOTE — Telephone Encounter (Signed)
Copied from [place] [id number]. Topic: Medicare AWV >> Jul 21, 2021 10:11 AM Cher Nakai R wrote: Reason for CRM:  Left message for patient to call back and schedule Medicare Annual Wellness Visit (AWV) in office.   If unable to come into the office for AWV,  please offer to do virtually or by telephone.  No hx of AWV eligible for AWVI as of 08/03/2020  Please schedule at anytime with [facility] Health Advisor.      40 Minutes appointment   Any questions, please call me at [phone]

## 2021-07-21 NOTE — Telephone Encounter (Signed)
Copied from [place] [id number]. Topic: Medicare AWV >> Jul 21, 2021 10:11 AM Cher Nakai R wrote: Reason for CRM:  Left message for patient to call back and schedule Medicare Annual Wellness Visit (AWV) in office.   If unable to come into the office for AWV,  please offer to do virtually or by telephone.  No hx of AWV eligible for AWVI as of 08/03/2020  Please schedule at anytime with [facility] Health Advisor.      40 Minutes appointment   Any questions, please call me at [phone]

## 2021-08-10 ENCOUNTER — Other Ambulatory Visit: Payer: Self-pay

## 2021-08-10 ENCOUNTER — Telehealth: Payer: Self-pay | Admitting: Internal Medicine

## 2021-08-10 DIAGNOSIS — L989 Disorder of the skin and subcutaneous tissue, unspecified: Secondary | ICD-10-CM

## 2021-08-10 NOTE — Telephone Encounter (Signed)
Copied from [place] [phone]. Topic: Referral - Request for Referral >> Aug 10, 2021  3:55 PM Alanda Slim E wrote: Has patient seen PCP for this complaint? Yes  *If NO, is insurance requiring patient see PCP for this issue before PCP can refer them? Referral for which specialty: Dermatology  Preferred provider/office: a female dermatologist  Reason for referral: bumps on back

## 2021-08-10 NOTE — Telephone Encounter (Signed)
Placed referral for [facility] in [place] ( FEMALE DERMATOLOGIST ) in referral info.

## 2021-08-18 ENCOUNTER — Telehealth: Payer: Self-pay

## 2021-08-18 NOTE — Telephone Encounter (Signed)
I tried calling this patient back and her VM cannot except messages at this time. She was referred to dermatology in [place] on 02/08 recently. I need to know if they called her to schedule an appt. Waiting for call back.

## 2021-08-18 NOTE — Telephone Encounter (Signed)
Copied from [place] [phone]. Topic: General - Other >> Aug 18, 2021  2:14 PM Valere Dross wrote: Reason for CRM: Pt called in wanting to get some recommendations for a Dermatologist, and requested if someone could give her a call back, please advise.

## 2021-11-03 ENCOUNTER — Telehealth: Payer: Self-pay | Admitting: Internal Medicine

## 2021-11-03 NOTE — Telephone Encounter (Signed)
Copied from [place]. Topic: Medicare AWV ?>> Nov 03, 2021  2:18 PM Cher Nakai R wrote: ?Reason for CRM:  ?Left message for patient to call back and schedule Medicare Annual Wellness Visit (AWV) in office.  ? ?If unable to come into the office for AWV,  please offer to do virtually or by telephone. ? ?No hx of AWV eligible for AWVI per [place] as of 08/03/2020 ? ?Please schedule at anytime with [facility] Health Advisor.     ? ?45 minute appointment  ? ?Any questions, please call me at [phone] ?

## 2021-12-02 ENCOUNTER — Encounter: Payer: Medicare HMO | Admitting: Internal Medicine

## 2021-12-03 ENCOUNTER — Other Ambulatory Visit: Payer: Self-pay | Admitting: Internal Medicine

## 2021-12-03 DIAGNOSIS — E034 Atrophy of thyroid (acquired): Secondary | ICD-10-CM

## 2021-12-05 NOTE — Telephone Encounter (Signed)
Requested Prescriptions  Pending Prescriptions Disp Refills  . levothyroxine (SYNTHROID) 50 MCG tablet [Pharmacy Med Name: LEVOTHYROXINE 50 MCG TABLET] 90 tablet 0    Sig: TAKE 1 TABLET BY MOUTH EVERY DAY     Endocrinology:  Hypothyroid Agents Failed - 12/03/2021  8:49 AM      Failed - TSH in normal range and within 360 days    TSH  Date Value Ref Range Status  11/26/2020 2.530 0.450 - 4.500 uIU/mL Final         Passed - Valid encounter within last 12 months    Recent Outpatient Visits          7 months ago Stenosing tenosynovitis of finger of right hand   [facility] [name], MD   7 months ago Trigger middle finger of right hand   [facility] Glean Hess, MD   12 months ago Primary osteoarthritis of left knee   [facility] [name], MD   1 year ago Annual physical exam   [facility] Glean Hess, MD   1 year ago Annual physical exam   [facility] Glean Hess, MD      Future Appointments            In 3 weeks Army Melia Jesse Sans, MD [facility], [facility]

## 2021-12-30 ENCOUNTER — Encounter: Payer: Self-pay | Admitting: Internal Medicine

## 2021-12-30 ENCOUNTER — Ambulatory Visit (INDEPENDENT_AMBULATORY_CARE_PROVIDER_SITE_OTHER): Payer: Medicare HMO | Admitting: Internal Medicine

## 2021-12-30 VITALS — BP 162/92 | HR 77 | Ht 60.0 in | Wt 138.0 lb

## 2021-12-30 DIAGNOSIS — Z1231 Encounter for screening mammogram for malignant neoplasm of breast: Secondary | ICD-10-CM | POA: Diagnosis not present

## 2021-12-30 DIAGNOSIS — E782 Mixed hyperlipidemia: Secondary | ICD-10-CM | POA: Diagnosis not present

## 2021-12-30 DIAGNOSIS — R030 Elevated blood-pressure reading, without diagnosis of hypertension: Secondary | ICD-10-CM

## 2021-12-30 DIAGNOSIS — E034 Atrophy of thyroid (acquired): Secondary | ICD-10-CM

## 2021-12-30 DIAGNOSIS — Z0000 Encounter for general adult medical examination without abnormal findings: Secondary | ICD-10-CM | POA: Diagnosis not present

## 2021-12-30 DIAGNOSIS — G3184 Mild cognitive impairment, so stated: Secondary | ICD-10-CM

## 2021-12-30 DIAGNOSIS — Z23 Encounter for immunization: Secondary | ICD-10-CM | POA: Diagnosis not present

## 2021-12-30 DIAGNOSIS — M8580 Other specified disorders of bone density and structure, unspecified site: Secondary | ICD-10-CM

## 2021-12-30 NOTE — Patient Instructions (Signed)
Check BP several times per week and record. Bring BP cuff to next appointment.

## 2021-12-30 NOTE — Progress Notes (Signed)
Date:  12/30/2021   Name:  Evelyn Daniels   DOB:  01/13/1956   MRN:  [id number]   Chief Complaint: Annual Exam (Breast exam no pap ) Evelyn Daniels is a 66 y.o. female who presents today for her Complete Annual Exam. She feels well. She reports exercising none. She reports she is sleeping well. Breast complaints none.  Mammogram: 10/2020 [facility] DEXA: 09/2019 osteopenia Pap smear: discontinued Colonoscopy: 12/2020 repeat 5 yrs  Health Maintenance Due  Topic Date Due   Pneumonia Vaccine 50+ Years old (2 - PPSV23 if available, else PCV20) 12/18/2020    Immunization History  Administered Date(s) Administered   Influenza,inj,Quad PF,6+ Mos 05/14/2015, 06/16/2016, 02/21/2019   Influenza-Unspecified 04/16/2017, 04/29/2021   Moderna Sars-Covid-2 Vaccination 08/15/2019, 09/15/2019, 08/07/2020, 11/30/2020   [place] Pediatric Vaccine(75mo to <557yr 07/16/2021   Pneumococcal Conjugate-13 12/19/2019   Tdap 05/14/2015   Zoster Recombinat (Shingrix) 08/22/2018, 12/21/2018   Zoster, Live 05/19/2011    Thyroid Problem Presents for follow-up visit. Patient reports no anxiety, constipation, diarrhea, fatigue, palpitations or tremors. The symptoms have been stable.   Memory issues - short term memory is very poor.  Asking questions repeated and not remembering other things that she is told.  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  Taking medications as directed.  No headaches, depression or change in sleep.  Only gets about 5 hours during the week.  Taking Prevagen for the past year. Lab Results  Component Value Date   NA 141 11/26/2020   K 4.1 11/26/2020   CO2 26 11/26/2020   GLUCOSE 102 (H) 11/26/2020   BUN 15 11/26/2020   CREATININE 0.84 11/26/2020   CALCIUM 10.2 11/26/2020   EGFR 77 11/26/2020   GFRNONAA 73 12/19/2019   Lab Results  Component Value Date   CHOL 246 (H) 11/26/2020   HDL 56 11/26/2020   LDLCALC 169 (H) 11/26/2020   TRIG 117 11/26/2020   CHOLHDL 4.4  11/26/2020   Lab Results  Component Value Date   TSH 2.530 11/26/2020   No results found for: "HGBA1C" Lab Results  Component Value Date   WBC 5.4 11/26/2020   HGB 14.0 11/26/2020   HCT 40.6 11/26/2020   MCV 92 11/26/2020   PLT 200 11/26/2020   Lab Results  Component Value Date   ALT 18 11/26/2020   AST 16 11/26/2020   ALKPHOS 71 11/26/2020   BILITOT 0.5 11/26/2020   Lab Results  Component Value Date   VD25OH 34.0 12/19/2019     Review of Systems  Constitutional:  Negative for chills, fatigue and fever.  HENT:  Negative for congestion, hearing loss, tinnitus, trouble swallowing and voice change.   Eyes:  Negative for visual disturbance.  Respiratory:  Negative for cough, chest tightness, shortness of breath and wheezing.   Cardiovascular:  Negative for chest pain, palpitations and leg swelling.  Gastrointestinal:  Negative for abdominal pain, constipation, diarrhea and vomiting.  Endocrine: Negative for polydipsia and polyuria.  Genitourinary:  Negative for dysuria, frequency, genital sores, vaginal bleeding and vaginal discharge.  Musculoskeletal:  Negative for arthralgias, gait problem and joint swelling.  Skin:  Negative for color change and rash.  Neurological:  Negative for dizziness, tremors, light-headedness and headaches.  Hematological:  Negative for adenopathy. Does not bruise/bleed easily.  Psychiatric/Behavioral:  Positive for decreased concentration. Negative for dysphoric mood and sleep disturbance. The patient is not nervous/anxious.     Patient Active Problem List   Diagnosis Date Noted   Stenosing  tenosynovitis of finger of right hand 05/06/2021   Trigger middle finger of right hand 04/22/2021   Primary osteoarthritis of left knee 12/10/2020   Osteopenia determined by x-ray 09/11/2019   Hypothyroidism due to acquired atrophy of thyroid 05/14/2015   Constipation 05/14/2015   Mixed hyperlipidemia 04/21/2015   History of hay fever 04/21/2015    History of colon polyps 04/21/2015   Hot flash, menopausal 04/21/2015   Plantar fasciitis 04/21/2015    Allergies  Allergen Reactions   Codeine Anaphylaxis    Past Surgical History:  Procedure Laterality Date   TUBAL LIGATION      Social History   Tobacco Use   Smoking status: Never   Smokeless tobacco: Never  Vaping Use   Vaping Use: Never used  Substance Use Topics   Alcohol use: Not Currently   Drug use: Never     Medication list has been reviewed and updated.  Current Meds  Medication Sig   Apoaequorin (PREVAGEN PO) Take 1 tablet by mouth daily.   CALCIUM PO Take 650 mg by mouth in the morning and at bedtime.   Cholecalciferol 1000 UNITS capsule Take 1,000 Units by mouth daily.   Cinnamon 500 MG capsule Take 500 mg by mouth daily.   levothyroxine (SYNTHROID) 50 MCG tablet TAKE 1 TABLET BY MOUTH EVERY DAY   loratadine (CLARITIN) 10 MG tablet Take 1 tablet by mouth daily.   meloxicam (MOBIC) 15 MG tablet Take 1 tablet (15 mg total) by mouth daily as needed for pain.   Multiple Vitamins-Minerals (WOMENS MULTIVITAMIN PLUS) TABS Take 1 tablet by mouth daily as needed.   Omega-3 Fatty Acids (FISH OIL) 1000 MG CAPS Take 1,000 mg by mouth daily.   vitamin C (ASCORBIC ACID) 500 MG tablet Take 500 mg by mouth daily.       12/30/2021   10:08 AM 12/10/2020    9:49 AM 11/26/2020    9:43 AM 12/19/2019    9:40 AM  GAD 7 : Generalized Anxiety Score  Nervous, Anxious, on Edge 0 0 0 0  Control/stop worrying 0 0 0 0  Worry too much - different things 0 0 0 0  Trouble relaxing 0 0 0 0  Restless 0 0 0 0  Easily annoyed or irritable 0 0 0 0  Afraid - awful might happen 0 0 0 0  Total GAD 7 Score 0 0 0 0  Anxiety Difficulty Not difficult at all Not difficult at all Not difficult at all Not difficult at all       12/30/2021   10:08 AM 12/10/2020    9:49 AM 11/26/2020    9:43 AM  Depression screen PHQ 2/9  Decreased Interest 0 0 0  Down, Depressed, Hopeless 0 0 0  PHQ - 2  Score 0 0 0  Altered sleeping 0 0 0  Tired, decreased energy 0 0 0  Change in appetite 0 0 0  Feeling bad or failure about yourself  0 0 0  Trouble concentrating 1 0 0  Moving slowly or fidgety/restless 0 0 0  Suicidal thoughts 0 0 0  PHQ-9 Score 1 0 0  Difficult doing work/chores Not difficult at all Not difficult at all Not difficult at all      12/30/2021   10:02 AM  6CIT Screen  What Year? 0 points  What month? 0 points  What time? 0 points  Count back from 20 0 points  Months in reverse 4 points  Repeat phrase 10 points  Total Score 14 points      BP Readings from Last 3 Encounters:  12/30/21 (!) 162/92  05/06/21 112/76  04/22/21 132/68    Physical Exam Vitals and nursing note reviewed.  Constitutional:      General: She is not in acute distress.    Appearance: She is well-developed.  HENT:     Head: Normocephalic and atraumatic.     Right Ear: Tympanic membrane and ear canal normal.     Left Ear: Tympanic membrane and ear canal normal.     Nose:     Right Sinus: No maxillary sinus tenderness.     Left Sinus: No maxillary sinus tenderness.  Eyes:     General: No scleral icterus.       Right eye: No discharge.        Left eye: No discharge.     Conjunctiva/sclera: Conjunctivae normal.  Neck:     Thyroid: No thyromegaly.     Vascular: No carotid bruit.  Cardiovascular:     Rate and Rhythm: Normal rate and regular rhythm.     Pulses: Normal pulses.     Heart sounds: Normal heart sounds.  Pulmonary:     Effort: Pulmonary effort is normal. No respiratory distress.     Breath sounds: No wheezing.  Chest:  Breasts:    Right: No mass, nipple discharge, skin change or tenderness.     Left: No mass, nipple discharge, skin change or tenderness.  Abdominal:     General: Bowel sounds are normal.     Palpations: Abdomen is soft.     Tenderness: There is no abdominal tenderness.  Musculoskeletal:     Cervical back: Normal range of motion. No erythema.      Right lower leg: No edema.     Left lower leg: No edema.  Lymphadenopathy:     Cervical: No cervical adenopathy.  Skin:    General: Skin is warm and dry.     Findings: No rash.  Neurological:     General: No focal deficit present.     Mental Status: She is alert and oriented to person, place, and time.     Cranial Nerves: No cranial nerve deficit.     Sensory: Sensation is intact. No sensory deficit.     Motor: Motor function is intact.     Gait: Gait abnormal.     Deep Tendon Reflexes: Reflexes are normal and symmetric.  Psychiatric:        Attention and Perception: Attention normal.        Mood and Affect: Mood normal.     Wt Readings from Last 3 Encounters:  12/30/21 138 lb (62.6 kg)  05/06/21 136 lb (61.7 kg)  04/22/21 137 lb 9.6 oz (62.4 kg)    BP (!) 162/92 (BP Location: Left Arm, Cuff Size: Normal)   Pulse 77   Ht 5' (1.524 m)   Wt 138 lb (62.6 kg)   SpO2 96%   BMI 26.95 kg/m   Assessment and Plan: 1. Annual physical exam Normal exam except for memory concerns and elevated BP Check at home, record and return in 6 weeks for recheck Up to date on screenings and immunizations. - CBC with Differential/Platelet  2. Encounter for screening mammogram for breast cancer Schedule at [facility]  3. Need for vaccination for pneumococcus Given today - Pneumococcal conjugate vaccine 20-valent  4. Hypothyroidism due to acquired atrophy of thyroid supplemented - Hemoglobin A1c - TSH + free T4  5. Mixed hyperlipidemia  Check labs - Comprehensive metabolic panel - Lipid panel  6. Osteopenia determined by x-ray Continue calcium and vitamin D - VITAMIN D 25 Hydroxy (Vit-D Deficiency, Fractures)  7. MCI (mild cognitive impairment) Unclear pattern which may be more inattention. Screening labs and refer - Vitamin B12 - Sedimentation rate - Ambulatory referral to Neurology  8. Elevated BP without diagnosis of hypertension DASH diet, continue exercise, limit  sodium Return in 6 weeks   Partially dictated using Editor, commissioning. Any errors are unintentional.  Halina Maidens, MD [facility]  12/30/2021

## 2021-12-31 LAB — COMPREHENSIVE METABOLIC PANEL
ALT: 19 IU/L (ref 0–32)
AST: 20 IU/L (ref 0–40)
Albumin/Globulin Ratio: 2.4 — ABNORMAL HIGH (ref 1.2–2.2)
Albumin: 4.7 g/dL (ref 3.8–4.8)
Alkaline Phosphatase: 73 IU/L (ref 44–121)
BUN/Creatinine Ratio: 17 (ref 12–28)
BUN: 13 mg/dL (ref 8–27)
Bilirubin Total: 0.3 mg/dL (ref 0.0–1.2)
CO2: 24 mmol/L (ref 20–29)
Calcium: 9.7 mg/dL (ref 8.7–10.3)
Chloride: 104 mmol/L (ref 96–106)
Creatinine, Ser: 0.76 mg/dL (ref 0.57–1.00)
Globulin, Total: 2 g/dL (ref 1.5–4.5)
Glucose: 96 mg/dL (ref 70–99)
Potassium: 3.9 mmol/L (ref 3.5–5.2)
Sodium: 141 mmol/L (ref 134–144)
Total Protein: 6.7 g/dL (ref 6.0–8.5)
eGFR: 86 mL/min/{1.73_m2} (ref >59)

## 2021-12-31 LAB — VITAMIN D 25 HYDROXY (VIT D DEFICIENCY, FRACTURES): Vit D, 25-Hydroxy: 35.1 ng/mL (ref 30.0–100.0)

## 2021-12-31 LAB — CBC WITH DIFFERENTIAL/PLATELET
Basophils Absolute: 0.1 10*3/uL (ref 0.0–0.2)
Basos: 1 %
EOS (ABSOLUTE): 0.1 10*3/uL (ref 0.0–0.4)
Eos: 3 %
Hematocrit: 41.2 % (ref 34.0–46.6)
Hemoglobin: 14 g/dL (ref 11.1–15.9)
Immature Grans (Abs): 0 10*3/uL (ref 0.0–0.1)
Immature Granulocytes: 0 %
Lymphocytes Absolute: 1.6 10*3/uL (ref 0.7–3.1)
Lymphs: 31 %
MCH: 31.5 pg (ref 26.6–33.0)
MCHC: 34 g/dL (ref 31.5–35.7)
MCV: 93 fL (ref 79–97)
Monocytes Absolute: 0.4 10*3/uL (ref 0.1–0.9)
Monocytes: 7 %
Neutrophils Absolute: 3 10*3/uL (ref 1.4–7.0)
Neutrophils: 58 %
Platelets: 201 10*3/uL (ref 150–450)
RBC: 4.45 x10E6/uL (ref 3.77–5.28)
RDW: 12.5 % (ref 11.7–15.4)
WBC: 5.2 10*3/uL (ref 3.4–10.8)

## 2021-12-31 LAB — LIPID PANEL
Chol/HDL Ratio: 4.7 ratio — ABNORMAL HIGH (ref 0.0–4.4)
Cholesterol, Total: 245 mg/dL — ABNORMAL HIGH (ref 100–199)
HDL: 52 mg/dL (ref >39)
LDL Chol Calc (NIH): 169 mg/dL — ABNORMAL HIGH (ref 0–99)
Triglycerides: 133 mg/dL (ref 0–149)
VLDL Cholesterol Cal: 24 mg/dL (ref 5–40)

## 2021-12-31 LAB — VITAMIN B12: Vitamin B-12: 964 pg/mL (ref 232–1245)

## 2021-12-31 LAB — SEDIMENTATION RATE: Sed Rate: 2 mm/hr (ref 0–40)

## 2021-12-31 LAB — TSH+FREE T4
Free T4: 1.01 ng/dL (ref 0.82–1.77)
TSH: 3 u[IU]/mL (ref 0.450–4.500)

## 2021-12-31 LAB — HEMOGLOBIN A1C
Est. average glucose Bld gHb Est-mCnc: 114 mg/dL
Hgb A1c MFr Bld: 5.6 % (ref 4.8–5.6)

## 2022-02-10 DIAGNOSIS — Z1231 Encounter for screening mammogram for malignant neoplasm of breast: Secondary | ICD-10-CM | POA: Diagnosis not present

## 2022-02-24 DIAGNOSIS — N6342 Unspecified lump in left breast, subareolar: Secondary | ICD-10-CM | POA: Diagnosis not present

## 2022-03-03 ENCOUNTER — Ambulatory Visit: Payer: Medicare HMO | Admitting: Internal Medicine

## 2022-03-07 ENCOUNTER — Other Ambulatory Visit: Payer: Self-pay | Admitting: Internal Medicine

## 2022-03-07 DIAGNOSIS — E034 Atrophy of thyroid (acquired): Secondary | ICD-10-CM

## 2022-03-08 NOTE — Telephone Encounter (Signed)
Requested Prescriptions  Pending Prescriptions Disp Refills  . levothyroxine (SYNTHROID) 50 MCG tablet [Pharmacy Med Name: LEVOTHYROXINE 50 MCG TABLET] 90 tablet 0    Sig: TAKE 1 TABLET BY MOUTH EVERY DAY     Endocrinology:  Hypothyroid Agents Passed - 03/07/2022  1:53 AM      Passed - TSH in normal range and within 360 days    TSH  Date Value Ref Range Status  12/30/2021 3.000 0.450 - 4.500 uIU/mL Final         Passed - Valid encounter within last 12 months    Recent Outpatient Visits          2 months ago Annual physical exam   [facility], Jesse Sans, MD   10 months ago Stenosing tenosynovitis of finger of right hand   [facility] [place], Earley Abide, MD   10 months ago Trigger middle finger of right hand   [facility] [facility] at [facility], Jesse Sans, MD   1 year ago Primary osteoarthritis of left knee   [facility] [place], Earley Abide, MD   1 year ago Annual physical exam   [facility] at [facility], Jesse Sans, MD      Future Appointments            In 2 weeks [facility], Jesse Sans, MD [facility] at [facility]

## 2022-03-17 ENCOUNTER — Ambulatory Visit: Payer: Medicare HMO | Admitting: Internal Medicine

## 2022-03-24 ENCOUNTER — Ambulatory Visit (INDEPENDENT_AMBULATORY_CARE_PROVIDER_SITE_OTHER): Payer: Medicare HMO | Admitting: Internal Medicine

## 2022-03-24 ENCOUNTER — Encounter: Payer: Self-pay | Admitting: Internal Medicine

## 2022-03-24 VITALS — BP 136/82 | HR 76 | Ht 60.0 in | Wt 136.0 lb

## 2022-03-24 DIAGNOSIS — Z23 Encounter for immunization: Secondary | ICD-10-CM | POA: Diagnosis not present

## 2022-03-24 DIAGNOSIS — M79642 Pain in left hand: Secondary | ICD-10-CM

## 2022-03-24 DIAGNOSIS — R030 Elevated blood-pressure reading, without diagnosis of hypertension: Secondary | ICD-10-CM | POA: Diagnosis not present

## 2022-03-24 DIAGNOSIS — R928 Other abnormal and inconclusive findings on diagnostic imaging of breast: Secondary | ICD-10-CM | POA: Diagnosis not present

## 2022-03-24 DIAGNOSIS — G3184 Mild cognitive impairment, so stated: Secondary | ICD-10-CM | POA: Diagnosis not present

## 2022-03-24 NOTE — Progress Notes (Signed)
Date:  03/24/2022   Name:  Evelyn Daniels   DOB:  [birth year]   MRN:  [id number]   Chief Complaint: Hypertension  Hypertension This is a new (elevated in June - lifestyle mods advised) problem. The problem has been waxing and waning since onset. Pertinent negatives include no anxiety, chest pain, headaches, orthopnea, palpitations, peripheral edema or shortness of breath. There are no associated agents to hypertension. There are no known risk factors for coronary artery disease. Past treatments include lifestyle changes. The current treatment provides moderate improvement.  Hand Pain  The incident occurred more than 1 week ago. The incident occurred at home. The injury mechanism was a fall (had to grab the tub edge to prevent falling). The pain is present in the left hand. The quality of the pain is described as aching. The pain is mild. The pain has been Intermittent since the incident. Pertinent negatives include no chest pain, muscle weakness, numbness or tingling.   Memory concerns - noted in June, labs normal.  Referred to Neurology. Dr. Larkin Ina Mhoon at [facility].  She does not have an appointment - may have deleted a call.  She and her husband feel that her deficits are minor but unchanged.  Lab Results  Component Value Date   NA 141 12/30/2021   K 3.9 12/30/2021   CO2 24 12/30/2021   GLUCOSE 96 12/30/2021   BUN 13 12/30/2021   CREATININE 0.76 12/30/2021   CALCIUM 9.7 12/30/2021   EGFR 86 12/30/2021   GFRNONAA 73 12/19/2019   Lab Results  Component Value Date   CHOL 245 (H) 12/30/2021   HDL 52 12/30/2021   LDLCALC 169 (H) 12/30/2021   TRIG 133 12/30/2021   CHOLHDL 4.7 (H) 12/30/2021   Lab Results  Component Value Date   TSH 3.000 12/30/2021   Lab Results  Component Value Date   HGBA1C 5.6 12/30/2021   Lab Results  Component Value Date   WBC 5.2 12/30/2021   HGB 14.0 12/30/2021   HCT 41.2 12/30/2021   MCV 93 12/30/2021   PLT 201 12/30/2021   Lab Results  Component  Value Date   ALT 19 12/30/2021   AST 20 12/30/2021   ALKPHOS 73 12/30/2021   BILITOT 0.3 12/30/2021   Lab Results  Component Value Date   VD25OH 35.1 12/30/2021     Review of Systems  Constitutional:  Negative for appetite change, fatigue and fever.  Respiratory:  Negative for chest tightness and shortness of breath.   Cardiovascular:  Negative for chest pain, palpitations and orthopnea.  Musculoskeletal:  Positive for arthralgias (left hand).  Neurological:  Negative for dizziness, tingling, numbness and headaches.  Psychiatric/Behavioral:  Positive for confusion. Negative for dysphoric mood and sleep disturbance. The patient is not nervous/anxious.     Patient Active Problem List   Diagnosis Date Noted   MCI (mild cognitive impairment) 03/24/2022   Stenosing tenosynovitis of finger of right hand 05/06/2021   Trigger middle finger of right hand 04/22/2021   Primary osteoarthritis of left knee 12/10/2020   Osteopenia determined by x-ray 09/11/2019   Hypothyroidism due to acquired atrophy of thyroid 05/14/2015   Constipation 05/14/2015   Mixed hyperlipidemia 04/21/2015   History of hay fever 04/21/2015   History of colon polyps 04/21/2015   Hot flash, menopausal 04/21/2015   Plantar fasciitis 04/21/2015    Allergies  Allergen Reactions   Codeine Anaphylaxis    Past Surgical History:  Procedure Laterality Date   TUBAL LIGATION  Social History   Tobacco Use   Smoking status: Never   Smokeless tobacco: Never  Vaping Use   Vaping Use: Never used  Substance Use Topics   Alcohol use: Not Currently   Drug use: Never     Medication list has been reviewed and updated.  Current Meds  Medication Sig   Apoaequorin (PREVAGEN PO) Take 1 tablet by mouth daily.   CALCIUM PO Take 650 mg by mouth in the morning and at bedtime.   Cholecalciferol 1000 UNITS capsule Take 1,000 Units by mouth daily.   Cinnamon 500 MG capsule Take 500 mg by mouth daily.   levothyroxine  (SYNTHROID) 50 MCG tablet TAKE 1 TABLET BY MOUTH EVERY DAY   loratadine (CLARITIN) 10 MG tablet Take 1 tablet by mouth daily.   Multiple Vitamins-Minerals (WOMENS MULTIVITAMIN PLUS) TABS Take 1 tablet by mouth daily as needed.   Omega-3 Fatty Acids (FISH OIL) 1000 MG CAPS Take 1,000 mg by mouth daily.   vitamin C (ASCORBIC ACID) 500 MG tablet Take 500 mg by mouth daily.   [DISCONTINUED] meloxicam (MOBIC) 15 MG tablet Take 1 tablet (15 mg total) by mouth daily as needed for pain.       03/24/2022    3:34 PM 12/30/2021   10:08 AM 12/10/2020    9:49 AM 11/26/2020    9:43 AM  GAD 7 : Generalized Anxiety Score  Nervous, Anxious, on Edge 0 0 0 0  Control/stop worrying 0 0 0 0  Worry too much - different things 0 0 0 0  Trouble relaxing 0 0 0 0  Restless 0 0 0 0  Easily annoyed or irritable 0 0 0 0  Afraid - awful might happen 0 0 0 0  Total GAD 7 Score 0 0 0 0  Anxiety Difficulty Not difficult at all Not difficult at all Not difficult at all Not difficult at all       03/24/2022    3:34 PM 12/30/2021   10:08 AM 12/10/2020    9:49 AM  Depression screen PHQ 2/9  Decreased Interest 0 0 0  Down, Depressed, Hopeless 0 0 0  PHQ - 2 Score 0 0 0  Altered sleeping 0 0 0  Tired, decreased energy 0 0 0  Change in appetite 0 0 0  Feeling bad or failure about yourself  0 0 0  Trouble concentrating 0 1 0  Moving slowly or fidgety/restless 0 0 0  Suicidal thoughts 0 0 0  PHQ-9 Score 0 1 0  Difficult doing work/chores Not difficult at all Not difficult at all Not difficult at all      03/24/2022    3:35 PM 12/30/2021   10:02 AM  6CIT Screen  What Year? 0 points 0 points  What month? 3 points 0 points  What time? 0 points 0 points  Count back from 20 0 points 0 points  Months in reverse 4 points 4 points  Repeat phrase 6 points 10 points  Total Score 13 points 14 points      BP Readings from Last 3 Encounters:  03/24/22 136/82  12/30/21 (!) 162/92  05/06/21 112/76    Physical  Exam Constitutional:      Appearance: Normal appearance.  Neck:     Vascular: No carotid bruit.  Cardiovascular:     Rate and Rhythm: Normal rate and regular rhythm.     Pulses: Normal pulses.     Heart sounds: No murmur heard. Pulmonary:  Effort: Pulmonary effort is normal.     Breath sounds: No wheezing or rhonchi.  Musculoskeletal:     Left hand: Bony tenderness (3rd metacarpal distally) present. No swelling or deformity. Normal strength. Normal sensation.     Cervical back: Normal range of motion.     Right lower leg: No edema.     Left lower leg: No edema.  Lymphadenopathy:     Cervical: No cervical adenopathy.  Neurological:     Mental Status: She is alert.  Psychiatric:        Cognition and Memory: She exhibits impaired recent memory.     Wt Readings from Last 3 Encounters:  03/24/22 136 lb (61.7 kg)  12/30/21 138 lb (62.6 kg)  05/06/21 136 lb (61.7 kg)    BP 136/82 (BP Location: Right Arm, Cuff Size: Normal)   Pulse 76   Ht 5' (1.524 m)   Wt 136 lb (61.7 kg)   SpO2 94%   BMI 26.56 kg/m   Assessment and Plan: 1. Elevated BP without diagnosis of hypertension BP remains borderline Continue lifestyle changes and home monitoring If reading increase, follow up here sooner than 4 months.  2. MCI (mild cognitive impairment) 6-CIT is unchanged She has not been scheduled with Neurology Number and name given so she can call directly  3. Pain of left hand Likely mild contusion - no evidence of fracture or dislocation Continue conservative care, tylenol if needed  4. Abnormal mammogram of left breast Done last month- dilated duct without suspicious features Agree with recommended follow up Dx left mammo and [place] in 6 months.   Partially dictated using Editor, commissioning. Any errors are unintentional.  Halina Maidens, MD [facility]  03/24/2022

## 2022-03-24 NOTE — Patient Instructions (Addendum)
Dr. Larkin Ina Mhoon - [facility] [address] [place], [place] [address] Office: [phone]  Fax: [phone]  Continue home monitoring of blood pressure.   Goal BP < 135/80 If the majority of readings are higher than this, please return for consideration of medication.

## 2022-05-20 DIAGNOSIS — R030 Elevated blood-pressure reading, without diagnosis of hypertension: Secondary | ICD-10-CM | POA: Diagnosis not present

## 2022-05-20 DIAGNOSIS — Z803 Family history of malignant neoplasm of breast: Secondary | ICD-10-CM | POA: Diagnosis not present

## 2022-05-20 DIAGNOSIS — E039 Hypothyroidism, unspecified: Secondary | ICD-10-CM | POA: Diagnosis not present

## 2022-05-20 DIAGNOSIS — Z87891 Personal history of nicotine dependence: Secondary | ICD-10-CM | POA: Diagnosis not present

## 2022-05-20 DIAGNOSIS — R69 Illness, unspecified: Secondary | ICD-10-CM | POA: Diagnosis not present

## 2022-05-20 DIAGNOSIS — Z833 Family history of diabetes mellitus: Secondary | ICD-10-CM | POA: Diagnosis not present

## 2022-05-20 DIAGNOSIS — T7840XD Allergy, unspecified, subsequent encounter: Secondary | ICD-10-CM | POA: Diagnosis not present

## 2022-06-07 IMAGING — CR DG KNEE COMPLETE 4+V*L*
4 series · 4 of 4 positions shown · non-contrast
Comparison: None

CLINICAL DATA: Chronic LEFT knee pain for awhile, pain adjacent to
lateral margin of mid patella, no recent injury

EXAM:
LEFT KNEE - COMPLETE 4+ VIEW

[knee ap]
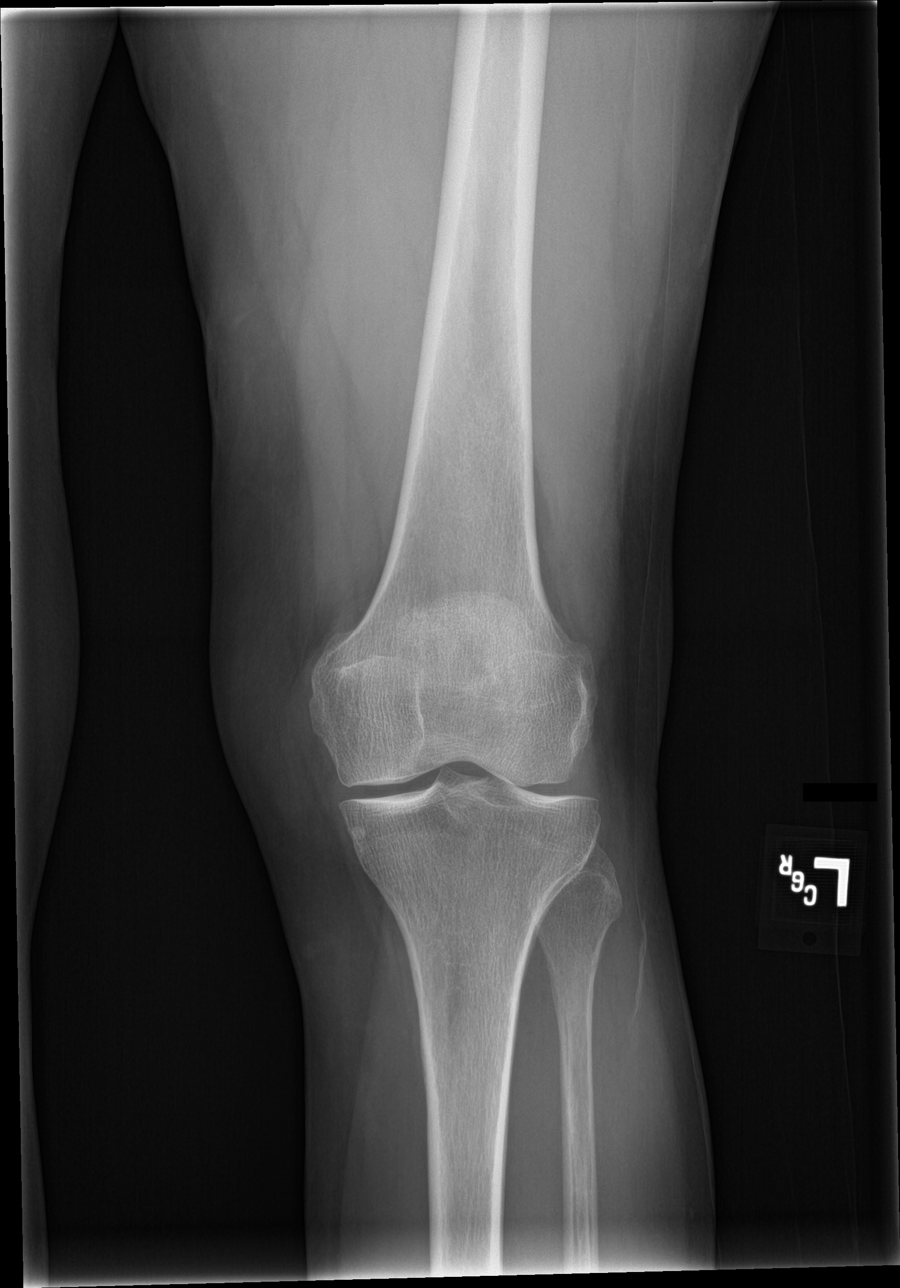

[knee lat]
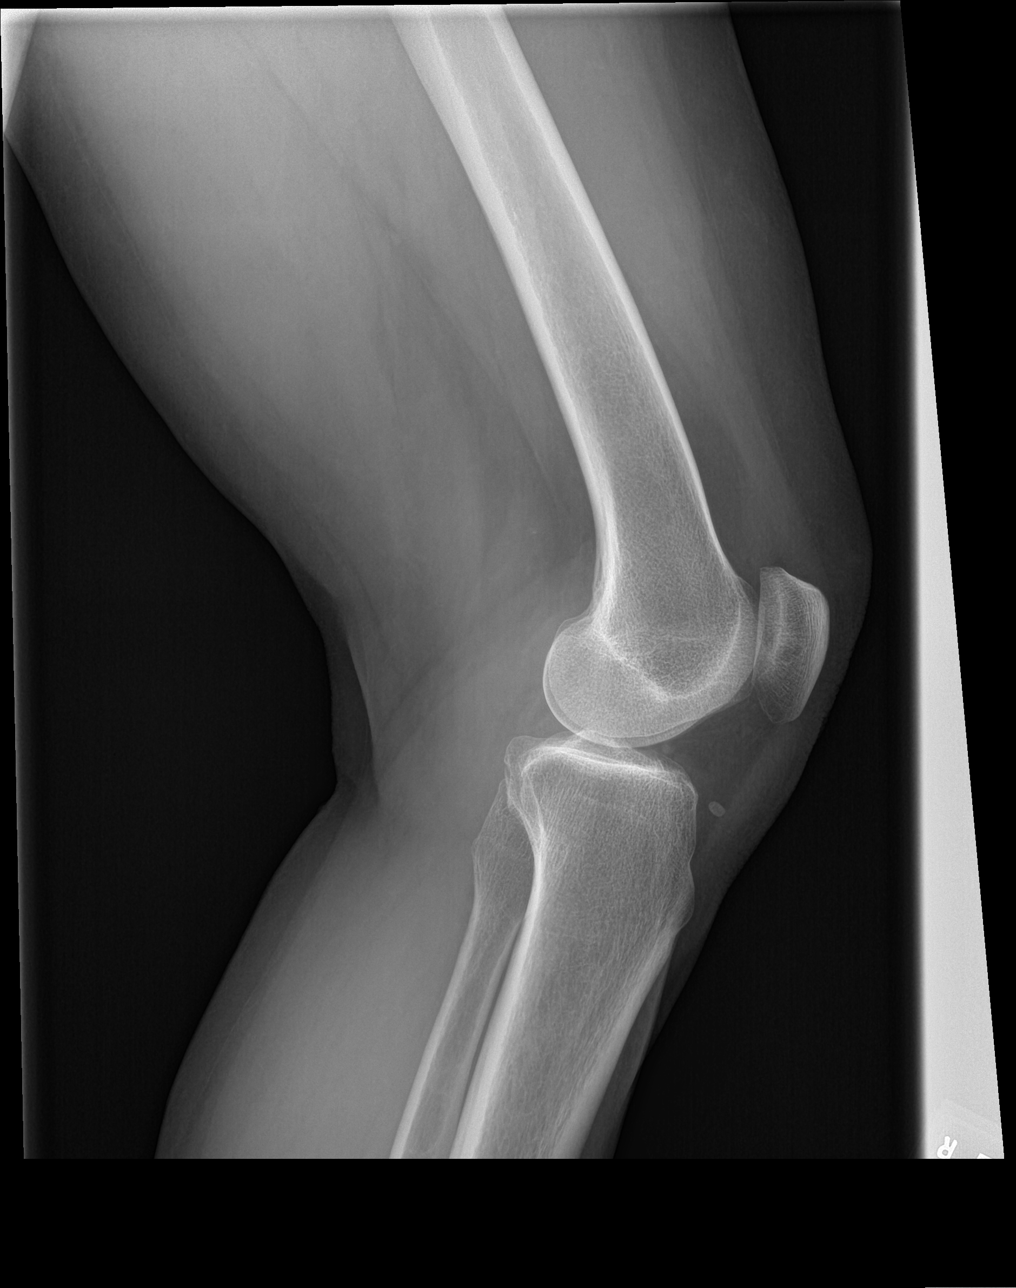

[tunnel]
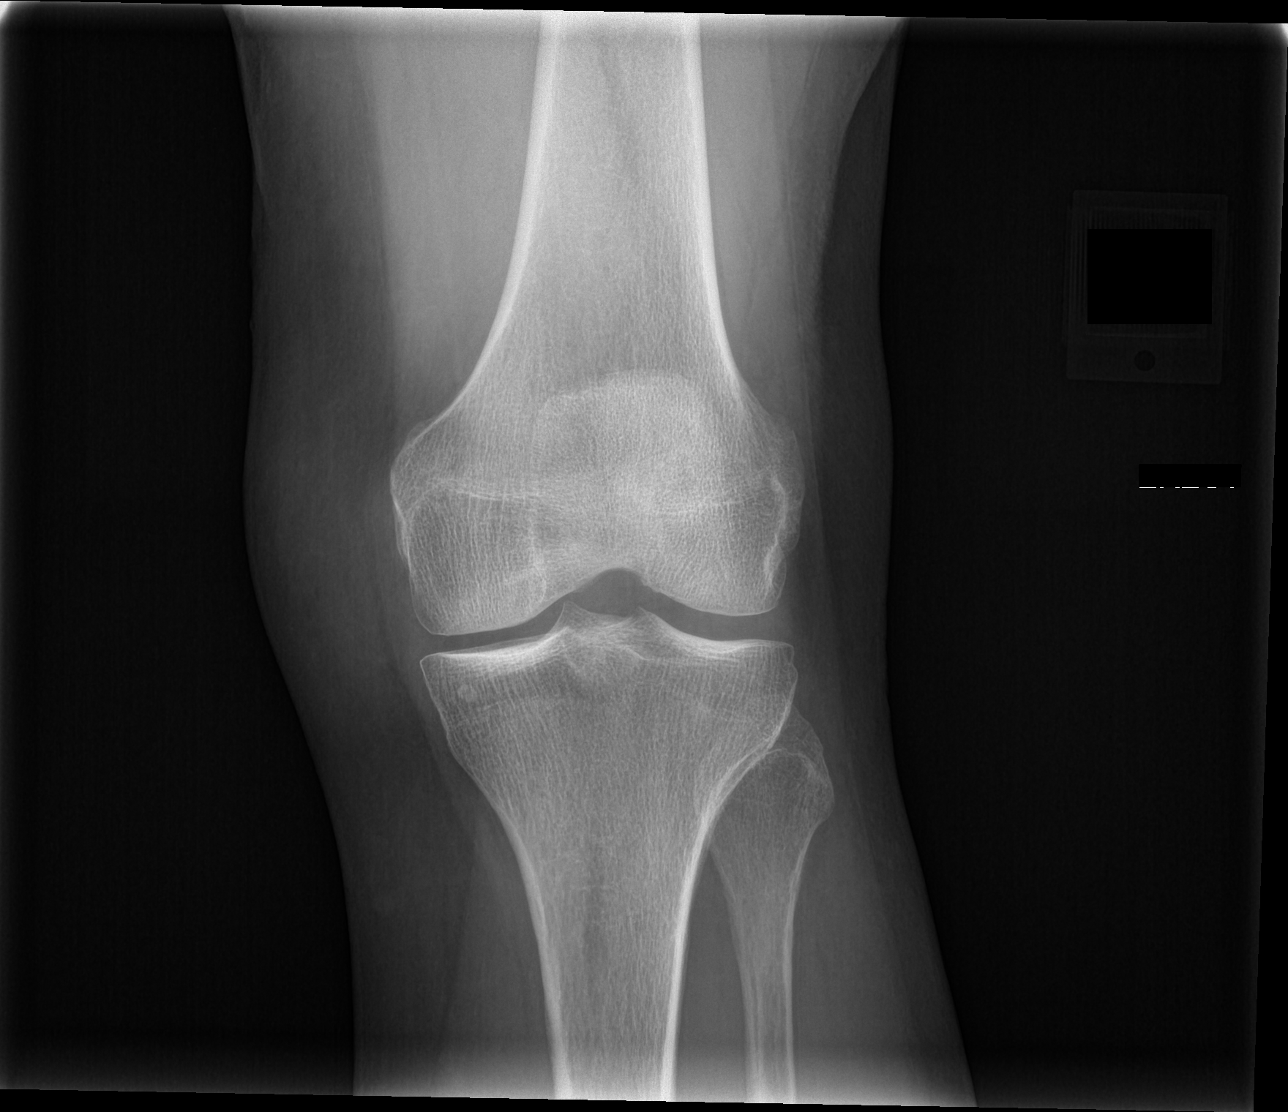

[patella skyline]
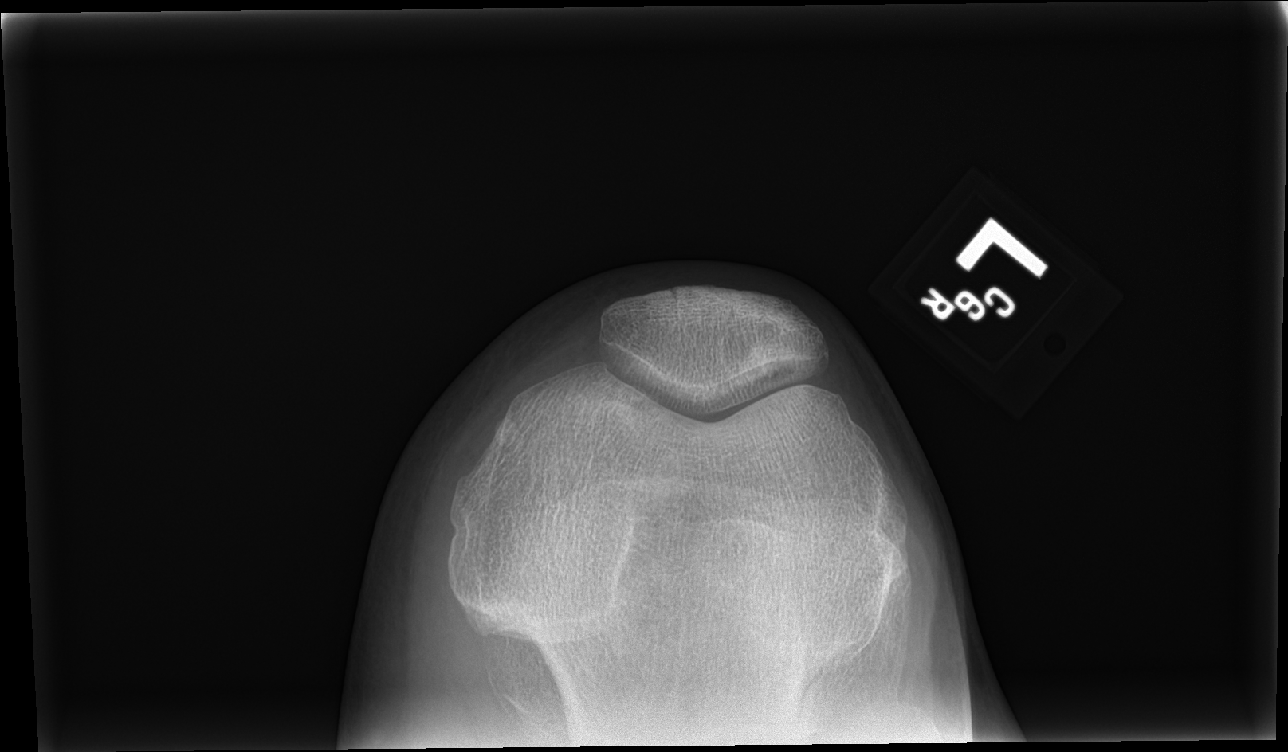

[4 of 4 positions shown; findings below may reference images not displayed]

FINDINGS: Osseous demineralization.

Mild joint space narrowing.

No fracture, dislocation, or bone destruction.

No joint effusion.
IMPRESSION: Mild degenerative changes and osseous demineralization LEFT knee.

No acute abnormalities.

## 2022-06-19 ENCOUNTER — Telehealth: Payer: Self-pay | Admitting: Internal Medicine

## 2022-06-19 NOTE — Telephone Encounter (Signed)
Copied from [place] [id number]. Topic: Medicare AWV >> Jun 19, 2022  1:54 PM Devoria Glassing wrote: Reason for CRM: Called patient to schedule Medicare Annual Wellness Visit (AWV) with [facility] Health Advisor.  Appointment can be an offiice/telephone or virtual visit;  Please call [phone] ask for [facility].

## 2022-06-23 DIAGNOSIS — L821 Other seborrheic keratosis: Secondary | ICD-10-CM | POA: Diagnosis not present

## 2022-06-23 DIAGNOSIS — D225 Melanocytic nevi of trunk: Secondary | ICD-10-CM | POA: Diagnosis not present

## 2022-06-23 DIAGNOSIS — L814 Other melanin hyperpigmentation: Secondary | ICD-10-CM | POA: Diagnosis not present

## 2022-07-12 ENCOUNTER — Telehealth: Payer: Self-pay | Admitting: Internal Medicine

## 2022-07-12 NOTE — Telephone Encounter (Signed)
Copied from [place] [id number]. Topic: Medicare AWV >> Jul 12, 2022  1:54 PM Devoria Glassing wrote: Reason for CRM: Attempted to schedule AWV. Unable to LVM.  Will try at later time.

## 2022-07-28 ENCOUNTER — Ambulatory Visit (INDEPENDENT_AMBULATORY_CARE_PROVIDER_SITE_OTHER): Payer: Medicare HMO | Admitting: Internal Medicine

## 2022-07-28 ENCOUNTER — Encounter: Payer: Self-pay | Admitting: Internal Medicine

## 2022-07-28 VITALS — BP 136/76 | HR 78 | Ht 60.0 in | Wt 140.0 lb

## 2022-07-28 DIAGNOSIS — R030 Elevated blood-pressure reading, without diagnosis of hypertension: Secondary | ICD-10-CM

## 2022-07-28 DIAGNOSIS — G3184 Mild cognitive impairment, so stated: Secondary | ICD-10-CM | POA: Diagnosis not present

## 2022-07-28 DIAGNOSIS — R928 Other abnormal and inconclusive findings on diagnostic imaging of breast: Secondary | ICD-10-CM | POA: Diagnosis not present

## 2022-07-28 NOTE — Assessment & Plan Note (Signed)
Mammo and [place] scheduled for 6 mo follow up

## 2022-07-28 NOTE — Assessment & Plan Note (Addendum)
Patient and husband feel that her mild deficits are unchanged She was referred to [facility] Neurology - appointment on 08/11/22

## 2022-07-28 NOTE — Assessment & Plan Note (Addendum)
BP has been controlled with lifestyle changes Work on reducing dietary sodium More regular exercise recommended

## 2022-07-28 NOTE — Progress Notes (Signed)
Date:  07/28/2022   Name:  Evelyn Daniels   DOB:  [birth year]   MRN:  [id number]   Chief Complaint: Hypertension and Memory Loss  Hypertension This is a chronic problem. The problem is controlled (at home 130/80). Pertinent negatives include no chest pain, headaches, palpitations, peripheral edema or shortness of breath. There are no known risk factors for coronary artery disease. Past treatments include lifestyle changes. The current treatment provides moderate improvement.  MCI - she actually feels like she is better.  Husband endorses no change - she continues to ask the same question several times.  She is still working full time and doing well there.  No other new concerns.  Neurology appointment is in February.  Lab Results  Component Value Date   NA 141 12/30/2021   K 3.9 12/30/2021   CO2 24 12/30/2021   GLUCOSE 96 12/30/2021   BUN 13 12/30/2021   CREATININE 0.76 12/30/2021   CALCIUM 9.7 12/30/2021   EGFR 86 12/30/2021   GFRNONAA 73 12/19/2019   Lab Results  Component Value Date   CHOL 245 (H) 12/30/2021   HDL 52 12/30/2021   LDLCALC 169 (H) 12/30/2021   TRIG 133 12/30/2021   CHOLHDL 4.7 (H) 12/30/2021   Lab Results  Component Value Date   TSH 3.000 12/30/2021   Lab Results  Component Value Date   HGBA1C 5.6 12/30/2021   Lab Results  Component Value Date   WBC 5.2 12/30/2021   HGB 14.0 12/30/2021   HCT 41.2 12/30/2021   MCV 93 12/30/2021   PLT 201 12/30/2021   Lab Results  Component Value Date   ALT 19 12/30/2021   AST 20 12/30/2021   ALKPHOS 73 12/30/2021   BILITOT 0.3 12/30/2021   Lab Results  Component Value Date   VD25OH 35.1 12/30/2021     Review of Systems  Constitutional:  Negative for chills, fatigue and fever.  Respiratory:  Negative for chest tightness, shortness of breath and wheezing.   Cardiovascular:  Negative for chest pain, palpitations and leg swelling.  Musculoskeletal:  Negative for arthralgias.  Neurological:  Negative for  dizziness and headaches.  Psychiatric/Behavioral:  Negative for dysphoric mood and sleep disturbance. The patient is not nervous/anxious.     Patient Active Problem List   Diagnosis Date Noted   MCI (mild cognitive impairment) 03/24/2022   Abnormal mammogram of left breast 03/24/2022   Elevated BP without diagnosis of hypertension 03/24/2022   Stenosing tenosynovitis of finger of right hand 05/06/2021   Trigger middle finger of right hand 04/22/2021   Primary osteoarthritis of left knee 12/10/2020   Osteopenia determined by x-ray 09/11/2019   Hypothyroidism due to acquired atrophy of thyroid 05/14/2015   Constipation 05/14/2015   Mixed hyperlipidemia 04/21/2015   History of hay fever 04/21/2015   History of colon polyps 04/21/2015   Hot flash, menopausal 04/21/2015   Plantar fasciitis 04/21/2015    Allergies  Allergen Reactions   Codeine Anaphylaxis    Past Surgical History:  Procedure Laterality Date   TUBAL LIGATION      Social History   Tobacco Use   Smoking status: Never   Smokeless tobacco: Never  Vaping Use   Vaping Use: Never used  Substance Use Topics   Alcohol use: Not Currently   Drug use: Never     Medication list has been reviewed and updated.  Current Meds  Medication Sig   Apoaequorin (PREVAGEN PO) Take 1 tablet by mouth daily.   CALCIUM PO  Take 650 mg by mouth in the morning and at bedtime.   Cholecalciferol 1000 UNITS capsule Take 1,000 Units by mouth daily.   Cinnamon 500 MG capsule Take 500 mg by mouth daily.   levothyroxine (SYNTHROID) 50 MCG tablet TAKE 1 TABLET BY MOUTH EVERY DAY   loratadine (CLARITIN) 10 MG tablet Take 1 tablet by mouth daily.   Multiple Vitamins-Minerals (WOMENS MULTIVITAMIN PLUS) TABS Take 1 tablet by mouth daily as needed.   Omega-3 Fatty Acids (FISH OIL) 1000 MG CAPS Take 1,000 mg by mouth daily.   vitamin C (ASCORBIC ACID) 500 MG tablet Take 500 mg by mouth daily.       07/28/2022    3:50 PM 03/24/2022    3:34  PM 12/30/2021   10:08 AM 12/10/2020    9:49 AM  GAD 7 : Generalized Anxiety Score  Nervous, Anxious, on Edge 0 0 0 0  Control/stop worrying 0 0 0 0  Worry too much - different things 0 0 0 0  Trouble relaxing 0 0 0 0  Restless 0 0 0 0  Easily annoyed or irritable 0 0 0 0  Afraid - awful might happen 0 0 0 0  Total GAD 7 Score 0 0 0 0  Anxiety Difficulty Not difficult at all Not difficult at all Not difficult at all Not difficult at all       07/28/2022    3:50 PM 03/24/2022    3:34 PM 12/30/2021   10:08 AM  Depression screen PHQ 2/9  Decreased Interest 0 0 0  Down, Depressed, Hopeless 0 0 0  PHQ - 2 Score 0 0 0  Altered sleeping 0 0 0  Tired, decreased energy 0 0 0  Change in appetite 0 0 0  Feeling bad or failure about yourself  0 0 0  Trouble concentrating 0 0 1  Moving slowly or fidgety/restless 0 0 0  Suicidal thoughts 0 0 0  PHQ-9 Score 0 0 1  Difficult doing work/chores Not difficult at all Not difficult at all Not difficult at all    BP Readings from Last 3 Encounters:  07/28/22 136/76  03/24/22 136/82  12/30/21 (!) 162/92    Physical Exam Vitals and nursing note reviewed.  Constitutional:      General: She is not in acute distress.    Appearance: Normal appearance. She is well-developed.  HENT:     Head: Normocephalic and atraumatic.  Neck:     Vascular: No carotid bruit.  Cardiovascular:     Rate and Rhythm: Normal rate and regular rhythm.     Pulses: Normal pulses.     Heart sounds: No murmur heard. Pulmonary:     Effort: Pulmonary effort is normal. No respiratory distress.     Breath sounds: No wheezing or rhonchi.  Musculoskeletal:     Cervical back: Normal range of motion.     Right lower leg: No edema.     Left lower leg: No edema.  Lymphadenopathy:     Cervical: No cervical adenopathy.  Skin:    General: Skin is warm and dry.     Capillary Refill: Capillary refill takes less than 2 seconds.     Findings: No rash.  Neurological:     Mental  Status: She is alert and oriented to person, place, and time.  Psychiatric:        Mood and Affect: Mood normal.        Behavior: Behavior normal.     Wt Readings  from Last 3 Encounters:  07/28/22 140 lb (63.5 kg)  03/24/22 136 lb (61.7 kg)  12/30/21 138 lb (62.6 kg)    BP 136/76 (BP Location: Left Arm, Cuff Size: Normal)   Pulse 78   Ht 5' (1.524 m)   Wt 140 lb (63.5 kg)   SpO2 98%   BMI 27.34 kg/m   Assessment and Plan: Problem List Items Addressed This Visit       Other   Abnormal mammogram of left breast (Chronic)    Mammo and [place] scheduled for 6 mo follow up      Elevated BP without diagnosis of hypertension - Primary (Chronic)    BP has been controlled with lifestyle changes Work on reducing dietary sodium More regular exercise recommended       MCI (mild cognitive impairment) (Chronic)    Patient and husband feel that her mild deficits are unchanged She was referred to [facility] - appointment on 08/11/22        Partially dictated using [employer]. Any errors are unintentional.  Halina Maidens, MD [facility]  07/28/2022

## 2022-08-08 ENCOUNTER — Telehealth: Payer: Self-pay | Admitting: Internal Medicine

## 2022-08-08 ENCOUNTER — Other Ambulatory Visit: Payer: Self-pay

## 2022-08-08 DIAGNOSIS — G3184 Mild cognitive impairment, so stated: Secondary | ICD-10-CM

## 2022-08-08 NOTE — Telephone Encounter (Signed)
Referral placed. Someone should call the patient to schedule.  Evelyn Daniels

## 2022-08-08 NOTE — Telephone Encounter (Signed)
Copied from [place] [id number]. Topic: Referral - Request for Referral >> Aug 08, 2022  4:01 PM Sabas Sous wrote: Has patient seen PCP for this complaint? Yes.   *If NO, is insurance requiring patient see PCP for this issue before PCP can refer them? Referral for which specialty: Neurology  Preferred provider/office: [place] Phone: [phone] / Fax: [facility] - Neurology  Reason for referral: Needs referral to be seen, says PCP told her to contact neuro regarding her brain

## 2022-08-11 DIAGNOSIS — G3184 Mild cognitive impairment, so stated: Secondary | ICD-10-CM | POA: Diagnosis not present

## 2022-08-24 ENCOUNTER — Telehealth: Payer: Self-pay | Admitting: Internal Medicine

## 2022-08-24 NOTE — Telephone Encounter (Signed)
Copied from [place] [id number]. Topic: Medicare AWV >> Aug 24, 2022  1:27 PM Devoria Glassing wrote: Reason for CRM: Called patient to schedule Medicare Annual Wellness Visit (AWV). Left message for patient to call back and schedule Medicare Annual Wellness Visit (AWV).  Last date of AWV: NONE  Please schedule an appointment at any time with Kirke Shaggy, [facility]    If any questions, please contact me.  Thank you ,  Sherol Dade; [place] Direct Dial: [phone]

## 2022-09-01 DIAGNOSIS — N6342 Unspecified lump in left breast, subareolar: Secondary | ICD-10-CM | POA: Diagnosis not present

## 2022-09-01 DIAGNOSIS — R92332 Mammographic heterogeneous density, left breast: Secondary | ICD-10-CM | POA: Diagnosis not present

## 2022-09-03 ENCOUNTER — Other Ambulatory Visit: Payer: Self-pay | Admitting: Internal Medicine

## 2022-09-03 DIAGNOSIS — E034 Atrophy of thyroid (acquired): Secondary | ICD-10-CM

## 2022-09-04 NOTE — Telephone Encounter (Signed)
Requested Prescriptions  Pending Prescriptions Disp Refills   levothyroxine (SYNTHROID) 50 MCG tablet [Pharmacy Med Name: LEVOTHYROXINE 50 MCG TABLET] 90 tablet 0    Sig: TAKE 1 TABLET BY MOUTH EVERY DAY     Endocrinology:  Hypothyroid Agents Passed - 09/03/2022  8:33 AM      Passed - TSH in normal range and within 360 days    TSH  Date Value Ref Range Status  12/30/2021 3.000 0.450 - 4.500 uIU/mL Final         Passed - Valid encounter within last 12 months    Recent Outpatient Visits           1 month ago Elevated BP without diagnosis of hypertension   [place] [facility] at [facility], Jesse Sans, MD   5 months ago Elevated BP without diagnosis of hypertension   [place] [facility] at [facility], Jesse Sans, MD   8 months ago Annual physical exam   [facility] at [facility], Jesse Sans, MD   1 year ago Stenosing tenosynovitis of finger of right hand   [place] [facility] [place], Earley Abide, MD   1 year ago Trigger middle finger of right hand   [place] at [facility], Jesse Sans, MD       Future Appointments             In 4 months Army Melia, Jesse Sans, MD [place] at [facility], [facility]

## 2022-09-22 DIAGNOSIS — G3184 Mild cognitive impairment, so stated: Secondary | ICD-10-CM | POA: Diagnosis not present

## 2022-09-22 DIAGNOSIS — R413 Other amnesia: Secondary | ICD-10-CM | POA: Diagnosis not present

## 2022-09-29 DIAGNOSIS — G3184 Mild cognitive impairment, so stated: Secondary | ICD-10-CM | POA: Diagnosis not present

## 2022-10-26 DIAGNOSIS — G3184 Mild cognitive impairment, so stated: Secondary | ICD-10-CM | POA: Diagnosis not present

## 2022-11-17 DIAGNOSIS — G3184 Mild cognitive impairment, so stated: Secondary | ICD-10-CM | POA: Diagnosis not present

## 2022-12-09 ENCOUNTER — Other Ambulatory Visit: Payer: Self-pay | Admitting: Internal Medicine

## 2022-12-09 DIAGNOSIS — E034 Atrophy of thyroid (acquired): Secondary | ICD-10-CM

## 2022-12-11 NOTE — Telephone Encounter (Signed)
Requested Prescriptions  Pending Prescriptions Disp Refills   levothyroxine (SYNTHROID) 50 MCG tablet [Pharmacy Med Name: LEVOTHYROXINE 50 MCG TABLET] 90 tablet 0    Sig: TAKE 1 TABLET BY MOUTH EVERY DAY     Endocrinology:  Hypothyroid Agents Passed - 12/09/2022  8:23 AM      Passed - TSH in normal range and within 360 days    TSH  Date Value Ref Range Status  12/30/2021 3.000 0.450 - 4.500 uIU/mL Final         Passed - Valid encounter within last 12 months    Recent Outpatient Visits           4 months ago Elevated BP without diagnosis of hypertension   [place] [facility] at [facility], Nyoka Cowden, MD   8 months ago Elevated BP without diagnosis of hypertension   [place] [facility] at [facility], Nyoka Cowden, MD   11 months ago Annual physical exam   [facility] at [facility], Nyoka Cowden, MD   1 year ago Stenosing tenosynovitis of finger of right hand   [place] [facility] at [facility], Ocie Bob, MD   1 year ago Trigger middle finger of right hand   [facility] at [facility], Nyoka Cowden, MD       Future Appointments             In 3 weeks Judithann Graves Nyoka Cowden, MD [facility] at [facility], [facility]

## 2023-01-05 ENCOUNTER — Encounter: Payer: Self-pay | Admitting: Internal Medicine

## 2023-01-05 ENCOUNTER — Ambulatory Visit (INDEPENDENT_AMBULATORY_CARE_PROVIDER_SITE_OTHER): Payer: Medicare HMO | Admitting: Internal Medicine

## 2023-01-05 ENCOUNTER — Ambulatory Visit (INDEPENDENT_AMBULATORY_CARE_PROVIDER_SITE_OTHER): Payer: Medicare HMO

## 2023-01-05 VITALS — BP 126/78 | HR 74 | Ht 60.0 in | Wt 125.2 lb

## 2023-01-05 VITALS — BP 140/80 | HR 74

## 2023-01-05 DIAGNOSIS — E782 Mixed hyperlipidemia: Secondary | ICD-10-CM | POA: Diagnosis not present

## 2023-01-05 DIAGNOSIS — G3184 Mild cognitive impairment, so stated: Secondary | ICD-10-CM | POA: Diagnosis not present

## 2023-01-05 DIAGNOSIS — Z0000 Encounter for general adult medical examination without abnormal findings: Secondary | ICD-10-CM

## 2023-01-05 DIAGNOSIS — E034 Atrophy of thyroid (acquired): Secondary | ICD-10-CM | POA: Diagnosis not present

## 2023-01-05 DIAGNOSIS — R634 Abnormal weight loss: Secondary | ICD-10-CM

## 2023-01-05 DIAGNOSIS — R112 Nausea with vomiting, unspecified: Secondary | ICD-10-CM

## 2023-01-05 NOTE — Patient Instructions (Signed)
Call [facility] to schedule your Mammogram at [phone].  Weight weekly and add a nutritional supplement daily if needed.

## 2023-01-05 NOTE — Assessment & Plan Note (Signed)
suspected early Alzheimers disease. N/V with higher dose Exelon patch - stop patch and discuss at Neuro visit 7/17

## 2023-01-05 NOTE — Patient Instructions (Signed)
  Ms. Seyoum , Thank you for taking time to come for your Medicare Wellness Visit. I appreciate your ongoing commitment to your health goals. Please review the following plan we discussed and let me know if I can assist you in the future.   These are the goals we discussed:  Goals      DIET - INCREASE WATER INTAKE     Increase physical activity        This is a list of the screening recommended for you and due dates:  Health Maintenance  Topic Date Due   Flu Shot  02/01/2023   Medicare Annual Wellness Visit  01/05/2024   Mammogram  08/31/2024   DTaP/Tdap/Td vaccine (2 - Td or Tdap) 05/13/2025   Colon Cancer Screening  01/21/2026   Pneumonia Vaccine  Completed   DEXA scan (bone density measurement)  Completed   Hepatitis C Screening  Completed   Zoster (Shingles) Vaccine  Completed   HPV Vaccine  Aged Out   COVID-19 Vaccine  Discontinued

## 2023-01-05 NOTE — Progress Notes (Signed)
Subjective:   Evelyn Daniels is a 67 y.o. female who presents for an Initial Medicare Annual Wellness Visit.  Visit Complete: In person  Review of Systems    Defer to PCP  Cardiac Risk Factors include: advanced age (>7men, >90 women)     Objective:    Today's Vitals   01/05/23 1103 01/05/23 1105  BP: (!) 140/80   Pulse: 74   SpO2: 99%   PainSc:  0-No pain   There is no height or weight on file to calculate BMI.     01/05/2023   11:07 AM 06/16/2016   10:11 AM  Advanced Directives  Does Patient Have a Medical Advance Directive? No No  Would patient like information on creating a medical advance directive? No - Patient declined No - Patient declined    Current Medications (verified) Outpatient Encounter Medications as of 01/05/2023  Medication Sig   Apoaequorin (PREVAGEN PO) Take 1 tablet by mouth daily.   CALCIUM PO Take 650 mg by mouth in the morning and at bedtime.   Cholecalciferol 1000 UNITS capsule Take 1,000 Units by mouth daily.   Cinnamon 500 MG capsule Take 500 mg by mouth daily.   levothyroxine (SYNTHROID) 50 MCG tablet TAKE 1 TABLET BY MOUTH EVERY DAY   loratadine (CLARITIN) 10 MG tablet Take 1 tablet by mouth daily.   Multiple Vitamins-Minerals (WOMENS MULTIVITAMIN PLUS) TABS Take 1 tablet by mouth daily as needed.   Omega-3 Fatty Acids (FISH OIL) 1000 MG CAPS Take 1,000 mg by mouth daily.   rivastigmine (EXELON) 9.5 mg/24hr 9.5 mg daily.   vitamin C (ASCORBIC ACID) 500 MG tablet Take 500 mg by mouth daily.   No facility-administered encounter medications on file as of 01/05/2023.    Allergies (verified) Codeine   History: Past Medical History:  Diagnosis Date   Allergy    Thyroid disease    Past Surgical History:  Procedure Laterality Date   TUBAL LIGATION     Family History  Problem Relation Age of Onset   Lung cancer Mother    Alcoholism Mother    Alzheimer's disease Father    Breast cancer Paternal Grandmother    Social History    Socioeconomic History   Marital status: Married    Spouse name: Deshayla Helke   Number of children: 2   Years of education: 14   Highest education level: Associate degree: academic program  Occupational History   Not on file  Tobacco Use   Smoking status: Never   Smokeless tobacco: Never  Vaping Use   Vaping Use: Never used  Substance and Sexual Activity   Alcohol use: Not Currently   Drug use: Never   Sexual activity: Yes    Partners: Male  Other Topics Concern   Not on file  Social History Narrative   Not on file   Social Determinants of Health   Financial Resource Strain: Low Risk  (01/05/2023)   Overall Financial Resource Strain (CARDIA)    Difficulty of Paying Living Expenses: Not hard at all  Food Insecurity: No Food Insecurity (01/05/2023)   Hunger Vital Sign    Worried About Running Out of Food in the Last Year: Never true    Ran Out of Food in the Last Year: Never true  Transportation Needs: No Transportation Needs (01/05/2023)   PRAPARE - Administrator, Civil Service (Medical): No    Lack of Transportation (Non-Medical): No  Physical Activity: Inactive (01/05/2023)   Exercise Vital Sign  Days of Exercise per Week: 0 days    Minutes of Exercise per Session: 0 min  Stress: Stress Concern Present (01/05/2023)   [employer] of Occupational Health - Occupational Stress Questionnaire    Feeling of Stress : To some extent  Social Connections: Not on file    Tobacco Counseling Counseling given: Not Answered   Clinical Intake:  Pre-visit preparation completed: Yes  Pain : No/denies pain Pain Score: 0-No pain     BMI - recorded: 24.45 Nutritional Status: BMI of 19-24  Normal Nutritional Risks: Unintentional weight loss Diabetes: No  How often do you need to have someone help you when you read instructions, pamphlets, or other written materials from your doctor or pharmacy?: 1 - Never  Interpreter Needed?: No  Information entered by ::  Margaretha Sheffield, CMA   Activities of Daily Living    01/05/2023   11:07 AM  In your present state of health, do you have any difficulty performing the following activities:  Hearing? 0  Vision? 0  Difficulty concentrating or making decisions? 1  Walking or climbing stairs? 0  Dressing or bathing? 0  Doing errands, shopping? 0  Preparing Food and eating ? N  Using the Toilet? N  In the past six months, have you accidently leaked urine? N  Do you have problems with loss of bowel control? N  Managing your Medications? N  Managing your Finances? N  Housekeeping or managing your Housekeeping? N    Patient Care Team: Reubin Milan, MD as PCP - General (Internal Medicine)  Indicate any recent Medical Services you may have received from other than [facility] providers in the past year (date may be approximate).     Assessment:   This is a routine wellness examination for [place].  Hearing/Vision screen Hearing Screening - Comments:: No concerns at this time. Vision Screening - Comments:: No concerns at this time.  Dietary issues and exercise activities discussed:     Goals Addressed             This Visit's Progress    DIET - INCREASE WATER INTAKE       Increase physical activity        Depression Screen    01/05/2023   11:01 AM 07/28/2022    3:50 PM 03/24/2022    3:34 PM 12/30/2021   10:08 AM 12/10/2020    9:49 AM 11/26/2020    9:43 AM 12/19/2019    9:40 AM  PHQ 2/9 Scores  PHQ - 2 Score 0 0 0 0 0 0 0  PHQ- 9 Score 1 0 0 1 0 0 0    Fall Risk    01/05/2023   11:01 AM 07/28/2022    3:50 PM 03/24/2022    3:35 PM 12/30/2021   10:09 AM 12/10/2020    9:49 AM  Fall Risk   Falls in the past year? 0 0 0 0 1  Number falls in past yr: 0 0 0 0 0  Injury with Fall? 0 0 0 0 0  Risk for fall due to : No Fall Risks No Fall Risks No Fall Risks No Fall Risks Orthopedic patient  Follow up Falls evaluation completed Falls evaluation completed Falls evaluation completed Falls evaluation  completed Falls evaluation completed    MEDICARE RISK AT HOME:   TIMED UP AND GO:  Was the test performed? Yes  Length of time to ambulate 10 feet: 10 sec Gait steady and fast without use of assistive device  Cognitive Function:        01/05/2023   11:08 AM 03/24/2022    3:35 PM 12/30/2021   10:02 AM  6CIT Screen  What Year? 0 points 0 points 0 points  What month? 0 points 3 points 0 points  What time? 0 points 0 points 0 points  Count back from 20 0 points 0 points 0 points  Months in reverse 4 points 4 points 4 points  Repeat phrase 4 points 6 points 10 points  Total Score 8 points 13 points 14 points    Immunizations Immunization History  Administered Date(s) Administered   Fluad Quad(high Dose 65+) 03/24/2022   Influenza,inj,Quad PF,6+ Mos 05/14/2015, 06/16/2016, 02/21/2019   Influenza-Unspecified 04/16/2017, 04/29/2021   Moderna Sars-Covid-2 Vaccination 08/15/2019, 09/15/2019, 08/07/2020, 11/30/2020   PNEUMOCOCCAL CONJUGATE-20 12/30/2021   Pfizer Covid Bivalent Pediatric Vaccine(88mos to <108yrs) 07/16/2021   Pneumococcal Conjugate-13 12/19/2019   Tdap 05/14/2015   Zoster Recombinant(Shingrix) 08/22/2018, 12/21/2018   Zoster, Live 05/19/2011    TDAP status: Up to date  Flu Vaccine status: Up to date  Pneumococcal vaccine status: Up to date  Covid-19 vaccine status: Completed vaccines  Qualifies for Shingles Vaccine? Yes   Zostavax completed Yes   Shingrix Completed?: Yes  Screening Tests Health Maintenance  Topic Date Due   INFLUENZA VACCINE  02/01/2023   Medicare Annual Wellness (AWV)  01/05/2024   MAMMOGRAM  08/31/2024   DTaP/Tdap/Td (2 - Td or Tdap) 05/13/2025   Colonoscopy  01/21/2026   Pneumonia Vaccine 19+ Years old  Completed   DEXA SCAN  Completed   Hepatitis C Screening  Completed   Zoster Vaccines- Shingrix  Completed   HPV VACCINES  Aged Out   COVID-19 Vaccine  Discontinued    Health Maintenance  There are no preventive care  reminders to display for this patient.   Colorectal cancer screening: Type of screening: Colonoscopy. Completed 01/21/21. Repeat every 5 years  Mammogram status: Completed 09/01/2022. Repeat every year  Bone Density status: Completed 09/05/2019. Results reflect: Bone density results: OSTEOPENIA. Repeat every 2 years.  Lung Cancer Screening: (Low Dose CT Chest recommended if Age 13-80 years, 20 pack-year currently smoking OR have quit w/in 15years.) does not qualify.   Additional Screening:  Hepatitis C Screening: does qualify; Completed 05/14/2015  Vision Screening: Recommended annual ophthalmology exams for early detection of glaucoma and other disorders of the eye. Is the patient up to date with their annual eye exam?  Yes  Who is the provider or what is the name of the office in which the patient attends annual eye exams? [facility]   Dental Screening: Recommended annual dental exams for proper oral hygiene   Community Resource Referral / Chronic Care Management: CRR required this visit?  No   CCM required this visit?  No     Plan:     I have personally reviewed and noted the following in the patient's chart:   Medical and social history Use of alcohol, tobacco or illicit drugs  Current medications and supplements including opioid prescriptions. Patient is not currently taking opioid prescriptions. Functional ability and status Nutritional status Physical activity Advanced directives List of other physicians Hospitalizations, surgeries, and ER visits in previous 12 months Vitals Screenings to include cognitive, depression, and falls Referrals and appointments  In addition, I have reviewed and discussed with patient certain preventive protocols, quality metrics, and best practice recommendations. A written personalized care plan for preventive services as well as general preventive health recommendations were  provided to patient.     Mariel Sleet, CMA   01/05/2023   After Visit Summary: Printed at front desk for patient.  Nurse Notes: None.

## 2023-01-05 NOTE — Progress Notes (Signed)
Date:  01/05/2023   Name:  Evelyn Daniels   DOB:  [birth year]   MRN:  [id number]   Chief Complaint: Annual Exam Evelyn Daniels is a 67 y.o. female who presents today for her Complete Annual Exam. She feels poorly. She reports exercising - none at this time.. She reports she is sleeping well. Breast complaints - none.  Mammogram: 09/2022 short term follow up - return to annual screening in 03/2023 DEXA: 09/2019 osteopenia Pap smear: discontinued Colonoscopy: 12/2020 repeat 5 yrs  There are no preventive care reminders to display for this patient.   Immunization History  Administered Date(s) Administered   Fluad Quad(high Dose 65+) 03/24/2022   Influenza,inj,Quad PF,6+ Mos 05/14/2015, 06/16/2016, 02/21/2019   Influenza-Unspecified 04/16/2017, 04/29/2021   Moderna Sars-Covid-2 Vaccination 08/15/2019, 09/15/2019, 08/07/2020, 11/30/2020   PNEUMOCOCCAL CONJUGATE-20 12/30/2021   Pfizer Covid Bivalent Pediatric Vaccine(43mos to <56yrs) 07/16/2021   Pneumococcal Conjugate-13 12/19/2019   Tdap 05/14/2015   Zoster Recombinant(Shingrix) 08/22/2018, 12/21/2018   Zoster, Live 05/19/2011    Thyroid Problem Presents for follow-up visit. Symptoms include weight loss. Patient reports no anxiety, constipation, diarrhea, fatigue, palpitations or tremors. (15 lbs over the past 6 months) The symptoms have been stable.  Emesis  This is a new problem. The current episode started in the past 7 days. The problem occurs 2 to 4 times per day (since increasing the dose of the Exelon patch). The emesis has an appearance of bile. There has been no fever. Associated symptoms include weight loss. Pertinent negatives include no abdominal pain, arthralgias, chest pain, chills, coughing, diarrhea, dizziness, fever or headaches.  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  May be early Alzheimers per lumbar puncture.  Medications started and may be a candidate for Lecnemab infusions.  Lab Results  Component Value Date    NA 141 12/30/2021   K 3.9 12/30/2021   CO2 24 12/30/2021   GLUCOSE 96 12/30/2021   BUN 13 12/30/2021   CREATININE 0.76 12/30/2021   CALCIUM 9.7 12/30/2021   EGFR 86 12/30/2021   GFRNONAA 73 12/19/2019   Lab Results  Component Value Date   CHOL 245 (H) 12/30/2021   HDL 52 12/30/2021   LDLCALC 169 (H) 12/30/2021   TRIG 133 12/30/2021   CHOLHDL 4.7 (H) 12/30/2021   Lab Results  Component Value Date   TSH 3.000 12/30/2021   Lab Results  Component Value Date   HGBA1C 5.6 12/30/2021   Lab Results  Component Value Date   WBC 5.2 12/30/2021   HGB 14.0 12/30/2021   HCT 41.2 12/30/2021   MCV 93 12/30/2021   PLT 201 12/30/2021   Lab Results  Component Value Date   ALT 19 12/30/2021   AST 20 12/30/2021   ALKPHOS 73 12/30/2021   BILITOT 0.3 12/30/2021   Lab Results  Component Value Date   VD25OH 35.1 12/30/2021     Review of Systems  Constitutional:  Positive for unexpected weight change and weight loss. Negative for chills, fatigue and fever.  HENT:  Negative for congestion, hearing loss, tinnitus, trouble swallowing and voice change.   Eyes:  Negative for visual disturbance.  Respiratory:  Negative for cough, chest tightness, shortness of breath and wheezing.   Cardiovascular:  Negative for chest pain, palpitations and leg swelling.  Gastrointestinal:  Positive for vomiting. Negative for abdominal pain, constipation and diarrhea.  Endocrine: Negative for polydipsia and polyuria.  Genitourinary:  Negative for dysuria, frequency, genital sores, vaginal bleeding and vaginal discharge.  Musculoskeletal:  Negative for arthralgias, gait problem and joint swelling.  Skin:  Negative for color change and rash.  Neurological:  Negative for dizziness, tremors, light-headedness and headaches.  Hematological:  Negative for adenopathy. Does not bruise/bleed easily.  Psychiatric/Behavioral:  Negative for dysphoric mood and sleep disturbance. The patient is not nervous/anxious.      Patient Active Problem List   Diagnosis Date Noted   MCI (mild cognitive impairment) 03/24/2022   Abnormal mammogram of left breast 03/24/2022   Elevated BP without diagnosis of hypertension 03/24/2022   Stenosing tenosynovitis of finger of right hand 05/06/2021   Trigger middle finger of right hand 04/22/2021   Primary osteoarthritis of left knee 12/10/2020   Osteopenia determined by x-ray 09/11/2019   Hypothyroidism due to acquired atrophy of thyroid 05/14/2015   Constipation 05/14/2015   Mixed hyperlipidemia 04/21/2015   History of hay fever 04/21/2015   History of colon polyps 04/21/2015   Hot flash, menopausal 04/21/2015   Plantar fasciitis 04/21/2015    Allergies  Allergen Reactions   Codeine Anaphylaxis    Past Surgical History:  Procedure Laterality Date   TUBAL LIGATION      Social History   Tobacco Use   Smoking status: Never   Smokeless tobacco: Never  Vaping Use   Vaping Use: Never used  Substance Use Topics   Alcohol use: Not Currently   Drug use: Never     Medication list has been reviewed and updated.  Current Meds  Medication Sig   Apoaequorin (PREVAGEN PO) Take 1 tablet by mouth daily.   CALCIUM PO Take 650 mg by mouth in the morning and at bedtime.   Cholecalciferol 1000 UNITS capsule Take 1,000 Units by mouth daily.   Cinnamon 500 MG capsule Take 500 mg by mouth daily.   levothyroxine (SYNTHROID) 50 MCG tablet TAKE 1 TABLET BY MOUTH EVERY DAY   loratadine (CLARITIN) 10 MG tablet Take 1 tablet by mouth daily.   Multiple Vitamins-Minerals (WOMENS MULTIVITAMIN PLUS) TABS Take 1 tablet by mouth daily as needed.   Omega-3 Fatty Acids (FISH OIL) 1000 MG CAPS Take 1,000 mg by mouth daily.   rivastigmine (EXELON) 9.5 mg/24hr 9.5 mg daily.   vitamin C (ASCORBIC ACID) 500 MG tablet Take 500 mg by mouth daily.       01/05/2023   11:02 AM 07/28/2022    3:50 PM 03/24/2022    3:34 PM 12/30/2021   10:08 AM  GAD 7 : Generalized Anxiety Score   Nervous, Anxious, on Edge 0 0 0 0  Control/stop worrying 0 0 0 0  Worry too much - different things 0 0 0 0  Trouble relaxing 0 0 0 0  Restless 0 0 0 0  Easily annoyed or irritable 0 0 0 0  Afraid - awful might happen 0 0 0 0  Total GAD 7 Score 0 0 0 0  Anxiety Difficulty Not difficult at all Not difficult at all Not difficult at all Not difficult at all       01/05/2023   11:01 AM 07/28/2022    3:50 PM 03/24/2022    3:34 PM  Depression screen PHQ 2/9  Decreased Interest 0 0 0  Down, Depressed, Hopeless 0 0 0  PHQ - 2 Score 0 0 0  Altered sleeping 0 0 0  Tired, decreased energy 0 0 0  Change in appetite 1 0 0  Feeling bad or failure about yourself  0 0 0  Trouble concentrating 0 0 0  Moving slowly  or fidgety/restless 0 0 0  Suicidal thoughts 0 0 0  PHQ-9 Score 1 0 0  Difficult doing work/chores Not difficult at all Not difficult at all Not difficult at all    BP Readings from Last 3 Encounters:  01/05/23 126/78  01/05/23 (!) 140/80  07/28/22 136/76    Physical Exam Vitals and nursing note reviewed.  Constitutional:      General: She is not in acute distress.    Appearance: Normal appearance. She is well-developed.  HENT:     Head: Normocephalic and atraumatic.     Right Ear: Tympanic membrane and ear canal normal.     Left Ear: Tympanic membrane and ear canal normal.     Nose:     Right Sinus: No maxillary sinus tenderness.     Left Sinus: No maxillary sinus tenderness.  Eyes:     General: No scleral icterus.       Right eye: No discharge.        Left eye: No discharge.     Conjunctiva/sclera: Conjunctivae normal.  Neck:     Thyroid: No thyromegaly.     Vascular: No carotid bruit.  Cardiovascular:     Rate and Rhythm: Normal rate and regular rhythm.     Pulses: Normal pulses.     Heart sounds: Normal heart sounds.  Pulmonary:     Effort: Pulmonary effort is normal. No respiratory distress.     Breath sounds: No wheezing.  Chest:  Breasts:    Right: No  mass, nipple discharge, skin change or tenderness.     Left: No mass, nipple discharge, skin change or tenderness.  Abdominal:     General: Bowel sounds are normal.     Palpations: Abdomen is soft.     Tenderness: There is no abdominal tenderness.  Musculoskeletal:        General: Normal range of motion.     Cervical back: Normal range of motion. No erythema.     Right lower leg: No edema.     Left lower leg: No edema.  Lymphadenopathy:     Cervical: No cervical adenopathy.  Skin:    General: Skin is warm and dry.     Capillary Refill: Capillary refill takes less than 2 seconds.     Findings: No rash.  Neurological:     General: No focal deficit present.     Mental Status: She is alert and oriented to person, place, and time.     Cranial Nerves: No cranial nerve deficit.     Sensory: No sensory deficit.     Deep Tendon Reflexes: Reflexes are normal and symmetric.     Comments: Poor short term memory  Psychiatric:        Attention and Perception: Attention normal.        Mood and Affect: Mood normal.        Behavior: Behavior normal.       01/05/2023   11:08 AM 03/24/2022    3:35 PM 12/30/2021   10:02 AM  6CIT Screen  What Year? 0 points 0 points 0 points  What month? 0 points 3 points 0 points  What time? 0 points 0 points 0 points  Count back from 20 0 points 0 points 0 points  Months in reverse 4 points 4 points 4 points  Repeat phrase 4 points 6 points 10 points  Total Score 8 points 13 points 14 points      Wt Readings from Last 3 Encounters:  01/05/23 125 lb 3.2 oz (56.8 kg)  07/28/22 140 lb (63.5 kg)  03/24/22 136 lb (61.7 kg)    BP 126/78 (BP Location: Left Arm, Cuff Size: Normal)   Pulse 74   Ht 5' (1.524 m)   Wt 125 lb 3.2 oz (56.8 kg)   SpO2 99%   BMI 24.45 kg/m   Assessment and Plan:  Problem List Items Addressed This Visit     Mixed hyperlipidemia (Chronic)    Managed with diet and exercise Lab Results  Component Value Date   LDLCALC 169  (H) 12/30/2021        Relevant Orders   Lipid panel   MCI (mild cognitive impairment) (Chronic)    suspected early Alzheimers disease. N/V with higher dose Exelon patch - stop patch and discuss at Neuro visit 7/17       Hypothyroidism due to acquired atrophy of thyroid (Chronic)    Supplemented but with unintentional weight loss. Will check thyroid panel and advise on dose adjustment if needed Lab Results  Component Value Date   TSH 3.000 12/30/2021        Relevant Orders   TSH+T4F+T3Free   Other Visit Diagnoses     Annual physical exam    -  Primary   Recommend more regular exercise up to date on screenings and immunizations   Unintentional weight loss       Relevant Orders   CBC with Differential/Platelet   Comprehensive metabolic panel   Nausea and vomiting, unspecified vomiting type       due to Exelon patch discontinue patch, increase fluid intake   Relevant Orders   CBC with Differential/Platelet   Comprehensive metabolic panel       No follow-ups on file.  MAW completed today by CMA. Partially dictated using Dragon software, any errors are not intentional.  Reubin Milan, MD [facility] [place], [place]

## 2023-01-05 NOTE — Assessment & Plan Note (Signed)
Managed with diet and exercise Lab Results  Component Value Date   LDLCALC 169 (H) 12/30/2021

## 2023-01-05 NOTE — Assessment & Plan Note (Signed)
Managed with fiber and water intake

## 2023-01-05 NOTE — Assessment & Plan Note (Addendum)
Supplemented but with unintentional weight loss. Will check thyroid panel and advise on dose adjustment if needed Lab Results  Component Value Date   TSH 3.000 12/30/2021

## 2023-01-06 LAB — COMPREHENSIVE METABOLIC PANEL
ALT: 10 IU/L (ref 0–32)
AST: 14 IU/L (ref 0–40)
Albumin: 4.4 g/dL (ref 3.9–4.9)
Alkaline Phosphatase: 74 IU/L (ref 44–121)
BUN/Creatinine Ratio: 14 (ref 12–28)
BUN: 11 mg/dL (ref 8–27)
Bilirubin Total: 0.5 mg/dL (ref 0.0–1.2)
CO2: 25 mmol/L (ref 20–29)
Calcium: 10.2 mg/dL (ref 8.7–10.3)
Chloride: 104 mmol/L (ref 96–106)
Creatinine, Ser: 0.78 mg/dL (ref 0.57–1.00)
Globulin, Total: 2.3 g/dL (ref 1.5–4.5)
Glucose: 124 mg/dL — ABNORMAL HIGH (ref 70–99)
Potassium: 3.8 mmol/L (ref 3.5–5.2)
Sodium: 142 mmol/L (ref 134–144)
Total Protein: 6.7 g/dL (ref 6.0–8.5)
eGFR: 83 mL/min/{1.73_m2} (ref >59)

## 2023-01-06 LAB — CBC WITH DIFFERENTIAL/PLATELET
Basophils Absolute: 0.1 10*3/uL (ref 0.0–0.2)
Basos: 1 %
EOS (ABSOLUTE): 0.1 10*3/uL (ref 0.0–0.4)
Eos: 2 %
Hematocrit: 39.7 % (ref 34.0–46.6)
Hemoglobin: 13.9 g/dL (ref 11.1–15.9)
Immature Grans (Abs): 0 10*3/uL (ref 0.0–0.1)
Immature Granulocytes: 0 %
Lymphocytes Absolute: 1.6 10*3/uL (ref 0.7–3.1)
Lymphs: 28 %
MCH: 31.7 pg (ref 26.6–33.0)
MCHC: 35 g/dL (ref 31.5–35.7)
MCV: 91 fL (ref 79–97)
Monocytes Absolute: 0.4 10*3/uL (ref 0.1–0.9)
Monocytes: 7 %
Neutrophils Absolute: 3.5 10*3/uL (ref 1.4–7.0)
Neutrophils: 62 %
Platelets: 211 10*3/uL (ref 150–450)
RBC: 4.38 x10E6/uL (ref 3.77–5.28)
RDW: 11.6 % — ABNORMAL LOW (ref 11.7–15.4)
WBC: 5.6 10*3/uL (ref 3.4–10.8)

## 2023-01-06 LAB — LIPID PANEL
Chol/HDL Ratio: 4.4 ratio (ref 0.0–4.4)
Cholesterol, Total: 236 mg/dL — ABNORMAL HIGH (ref 100–199)
HDL: 54 mg/dL (ref >39)
LDL Chol Calc (NIH): 162 mg/dL — ABNORMAL HIGH (ref 0–99)
Triglycerides: 110 mg/dL (ref 0–149)
VLDL Cholesterol Cal: 20 mg/dL (ref 5–40)

## 2023-01-06 LAB — TSH+T4F+T3FREE
Free T4: 1.13 ng/dL (ref 0.82–1.77)
T3, Free: 2.3 pg/mL (ref 2.0–4.4)
TSH: 1.1 u[IU]/mL (ref 0.450–4.500)

## 2023-01-07 ENCOUNTER — Encounter: Payer: Self-pay | Admitting: Internal Medicine

## 2023-01-19 DIAGNOSIS — G3184 Mild cognitive impairment, so stated: Secondary | ICD-10-CM | POA: Diagnosis not present

## 2023-02-16 DIAGNOSIS — Z1231 Encounter for screening mammogram for malignant neoplasm of breast: Secondary | ICD-10-CM | POA: Diagnosis not present

## 2023-03-10 ENCOUNTER — Other Ambulatory Visit: Payer: Self-pay | Admitting: Internal Medicine

## 2023-03-10 DIAGNOSIS — E034 Atrophy of thyroid (acquired): Secondary | ICD-10-CM

## 2023-03-24 DIAGNOSIS — Z008 Encounter for other general examination: Secondary | ICD-10-CM | POA: Diagnosis not present

## 2023-04-27 DIAGNOSIS — R630 Anorexia: Secondary | ICD-10-CM | POA: Diagnosis not present

## 2023-04-27 DIAGNOSIS — G3184 Mild cognitive impairment, so stated: Secondary | ICD-10-CM | POA: Diagnosis not present

## 2023-11-30 DIAGNOSIS — G3184 Mild cognitive impairment, so stated: Secondary | ICD-10-CM | POA: Diagnosis not present

## 2023-12-07 DIAGNOSIS — G3184 Mild cognitive impairment, so stated: Secondary | ICD-10-CM | POA: Diagnosis not present

## 2023-12-07 DIAGNOSIS — R41844 Frontal lobe and executive function deficit: Secondary | ICD-10-CM | POA: Diagnosis not present

## 2023-12-14 DIAGNOSIS — R41844 Frontal lobe and executive function deficit: Secondary | ICD-10-CM | POA: Diagnosis not present

## 2023-12-14 DIAGNOSIS — G3184 Mild cognitive impairment, so stated: Secondary | ICD-10-CM | POA: Diagnosis not present

## 2023-12-21 DIAGNOSIS — G3184 Mild cognitive impairment, so stated: Secondary | ICD-10-CM | POA: Diagnosis not present

## 2023-12-21 DIAGNOSIS — R41844 Frontal lobe and executive function deficit: Secondary | ICD-10-CM | POA: Diagnosis not present

## 2024-01-09 ENCOUNTER — Encounter: Payer: Self-pay | Admitting: Internal Medicine

## 2024-01-10 ENCOUNTER — Telehealth: Payer: Self-pay | Admitting: Internal Medicine

## 2024-01-10 NOTE — Telephone Encounter (Signed)
 Copied from CRM [id number]. Topic: Appointments - Appointment Scheduling >> Jan 10, 2024 11:56 AM Turkey B wrote: Pt called in says its ok for her to to phone call appt on july16 instead of in person

## 2024-01-16 ENCOUNTER — Ambulatory Visit: Payer: Medicare HMO

## 2024-01-24 ENCOUNTER — Telehealth: Payer: Self-pay | Admitting: Internal Medicine

## 2024-01-24 NOTE — Telephone Encounter (Signed)
 Copied from CRM #[id number]. Topic: General - Other >> Jan 23, 2024  5:29 PM Evelyn Daniels wrote: Reason for CRM: Pt called stated she received a call yesterday and today from the clinic with no message. Please call pt at [phone].

## 2024-02-29 ENCOUNTER — Encounter: Payer: Self-pay | Admitting: Internal Medicine

## 2024-02-29 ENCOUNTER — Ambulatory Visit (INDEPENDENT_AMBULATORY_CARE_PROVIDER_SITE_OTHER): Admitting: Internal Medicine

## 2024-02-29 VITALS — BP 122/74 | HR 78 | Ht 60.0 in | Wt 143.0 lb

## 2024-02-29 DIAGNOSIS — Z131 Encounter for screening for diabetes mellitus: Secondary | ICD-10-CM

## 2024-02-29 DIAGNOSIS — E034 Atrophy of thyroid (acquired): Secondary | ICD-10-CM | POA: Diagnosis not present

## 2024-02-29 DIAGNOSIS — Z1231 Encounter for screening mammogram for malignant neoplasm of breast: Secondary | ICD-10-CM

## 2024-02-29 DIAGNOSIS — E782 Mixed hyperlipidemia: Secondary | ICD-10-CM | POA: Diagnosis not present

## 2024-02-29 DIAGNOSIS — Z0000 Encounter for general adult medical examination without abnormal findings: Secondary | ICD-10-CM | POA: Diagnosis not present

## 2024-02-29 DIAGNOSIS — G3184 Mild cognitive impairment, so stated: Secondary | ICD-10-CM | POA: Diagnosis not present

## 2024-02-29 MED ORDER — LEVOTHYROXINE SODIUM 50 MCG PO TABS: 50.0000 ug | ORAL_TABLET | Freq: Every day | ORAL | 3 refills | Status: AC

## 2024-02-29 MED ORDER — ROSUVASTATIN CALCIUM 5 MG PO TABS: 5.0000 mg | ORAL_TABLET | Freq: Every day | ORAL | 1 refills | Status: AC

## 2024-02-29 NOTE — Assessment & Plan Note (Signed)
 Supplemented

## 2024-02-29 NOTE — Assessment & Plan Note (Addendum)
 Not currently on medications. Would likely benefit from LDL reduction to optimize CV risk. She agrees to a trial of Crestor .  Will have her follow up in 4 months.

## 2024-02-29 NOTE — Assessment & Plan Note (Signed)
 Last Neurology note May 2025: Continue to follow with PCP and other medical providers for management/ optimization of chronic medical conditions, including vascular risk factors Try to be cognitively and physically active the best you can  Establish with occupational therapy for cognitive training, driving recommendations  Continue Memantine 10mg  twice a day

## 2024-02-29 NOTE — Patient Instructions (Addendum)
 Call [facility] to schedule your Mammogram at [phone].   Start Crestor  5 mg once a day for cholesterol.  We will schedule you for a 4 mo follow up with the new doctor to get acquainted and have labs done.

## 2024-02-29 NOTE — Progress Notes (Signed)
 Date:  02/29/2024   Name:  Evelyn Daniels   DOB:  March 01, 1956   MRN:  [id number]   Chief Complaint: Annual Exam Evelyn Daniels is a 68 y.o. female who presents today for her Complete Annual Exam. She feels well. She reports exercising- none. She reports she is sleeping well. Breast complaints -none.  Health Maintenance  Topic Date Due   Medicare Annual Wellness Visit  01/05/2024   Mammogram  02/19/2024   Flu Shot  09/30/2024*   DEXA scan (bone density measurement)  09/04/2024   DTaP/Tdap/Td vaccine (2 - Td or Tdap) 05/13/2025   Colon Cancer Screening  01/22/2028   Pneumococcal Vaccine for age over 81  Completed   Hepatitis C Screening  Completed   Zoster (Shingles) Vaccine  Completed   HPV Vaccine  Aged Out   Meningitis B Vaccine  Aged Out   COVID-19 Vaccine  Discontinued  *Topic was postponed. The date shown is not the original due date.    Thyroid  Problem Presents for follow-up visit. Patient reports no anxiety, constipation, diarrhea, fatigue or palpitations. The symptoms have been stable. Her past medical history is significant for hyperlipidemia.  Hyperlipidemia This is a chronic problem. Recent lipid tests were reviewed and are high. Pertinent negatives include no chest pain, myalgias or shortness of breath. Current antihyperlipidemic treatment includes diet change. The current treatment provides mild improvement of lipids.  MCI - she reports doing well on Memantine.  She continues to work full time and drive.  She only drives to familiar places now.  She went to PT but did not find it helpful.  Overall it sounds like she is stable.   Review of Systems  Constitutional:  Negative for fatigue and unexpected weight change.  HENT:  Negative for trouble swallowing.   Eyes:  Negative for visual disturbance.  Respiratory:  Negative for cough, chest tightness, shortness of breath and wheezing.   Cardiovascular:  Negative for chest pain, palpitations and leg swelling.  Gastrointestinal:   Negative for abdominal pain, constipation and diarrhea.  Musculoskeletal:  Negative for arthralgias and myalgias.  Neurological:  Negative for dizziness, weakness, light-headedness and headaches.  Psychiatric/Behavioral:  Positive for confusion and decreased concentration. Negative for hallucinations. The patient is not nervous/anxious.      Lab Results  Component Value Date   NA 142 01/05/2023   K 3.8 01/05/2023   CO2 25 01/05/2023   GLUCOSE 124 (H) 01/05/2023   BUN 11 01/05/2023   CREATININE 0.78 01/05/2023   CALCIUM  10.2 01/05/2023   EGFR 83 01/05/2023   GFRNONAA 73 12/19/2019   Lab Results  Component Value Date   CHOL 236 (H) 01/05/2023   HDL 54 01/05/2023   LDLCALC 162 (H) 01/05/2023   TRIG 110 01/05/2023   CHOLHDL 4.4 01/05/2023   Lab Results  Component Value Date   TSH 1.100 01/05/2023   Lab Results  Component Value Date   HGBA1C 5.6 12/30/2021   Lab Results  Component Value Date   WBC 5.6 01/05/2023   HGB 13.9 01/05/2023   HCT 39.7 01/05/2023   MCV 91 01/05/2023   PLT 211 01/05/2023   Lab Results  Component Value Date   ALT 10 01/05/2023   AST 14 01/05/2023   ALKPHOS 74 01/05/2023   BILITOT 0.5 01/05/2023   Lab Results  Component Value Date   VD25OH 35.1 12/30/2021     Patient Active Problem List   Diagnosis Date Noted   MCI (mild cognitive impairment) 03/24/2022   Abnormal  mammogram of left breast 03/24/2022   Elevated BP without diagnosis of hypertension 03/24/2022   Stenosing tenosynovitis of finger of right hand 05/06/2021   Trigger middle finger of right hand 04/22/2021   Primary osteoarthritis of left knee 12/10/2020   Osteopenia determined by x-ray 09/11/2019   Hypothyroidism due to acquired atrophy of thyroid  05/14/2015   Constipation 05/14/2015   Mixed hyperlipidemia 04/21/2015   History of hay fever 04/21/2015   History of colon polyps 04/21/2015   Hot flash, menopausal 04/21/2015   Plantar fasciitis 04/21/2015    Allergies   Allergen Reactions   Codeine Anaphylaxis    Past Surgical History:  Procedure Laterality Date   TUBAL LIGATION      Social History   Tobacco Use   Smoking status: Never   Smokeless tobacco: Never  Vaping Use   Vaping status: Never Used  Substance Use Topics   Alcohol use: Not Currently   Drug use: Never     Medication list has been reviewed and updated.  Current Meds  Medication Sig   CALCIUM  PO Take 650 mg by mouth in the morning and at bedtime.   Cinnamon 500 MG capsule Take 500 mg by mouth daily.   loratadine (CLARITIN) 10 MG tablet Take 1 tablet by mouth daily.   memantine (NAMENDA) 10 MG tablet Take 10 mg by mouth 2 (two) times daily.   Multiple Vitamins-Minerals (WOMENS MULTIVITAMIN PLUS) TABS Take 1 tablet by mouth daily as needed.   rosuvastatin  (CRESTOR ) 5 MG tablet Take 1 tablet (5 mg total) by mouth daily.   [DISCONTINUED] levothyroxine  (SYNTHROID ) 50 MCG tablet TAKE 1 TABLET BY MOUTH EVERY DAY       02/29/2024    9:44 AM 01/05/2023   11:02 AM 07/28/2022    3:50 PM 03/24/2022    3:34 PM  GAD 7 : Generalized Anxiety Score  Nervous, Anxious, on Edge 0 0 0 0  Control/stop worrying 0 0 0 0  Worry too much - different things 0 0 0 0  Trouble relaxing 0 0 0 0  Restless 0 0 0 0  Easily annoyed or irritable 0 0 0 0  Afraid - awful might happen 0 0 0 0  Total GAD 7 Score 0 0 0 0  Anxiety Difficulty Not difficult at all Not difficult at all Not difficult at all Not difficult at all       02/29/2024    9:44 AM 01/05/2023   11:01 AM 07/28/2022    3:50 PM  Depression screen PHQ 2/9  Decreased Interest 0 0 0  Down, Depressed, Hopeless 0 0 0  PHQ - 2 Score 0 0 0  Altered sleeping 0 0 0  Tired, decreased energy 0 0 0  Change in appetite 0 1 0  Feeling bad or failure about yourself  0 0 0  Trouble concentrating 0 0 0  Moving slowly or fidgety/restless 0 0 0  Suicidal thoughts 0 0 0  PHQ-9 Score 0 1 0  Difficult doing work/chores Not difficult at all Not  difficult at all Not difficult at all    BP Readings from Last 3 Encounters:  02/29/24 122/74  01/05/23 126/78  01/05/23 (!) 140/80    Physical Exam Vitals and nursing note reviewed.  Constitutional:      General: She is not in acute distress.    Appearance: She is well-developed.  HENT:     Head: Normocephalic and atraumatic.     Right Ear: Tympanic membrane and ear canal  normal.     Left Ear: Tympanic membrane and ear canal normal.     Nose:     Right Sinus: No maxillary sinus tenderness.     Left Sinus: No maxillary sinus tenderness.  Eyes:     General: No scleral icterus.       Right eye: No discharge.        Left eye: No discharge.     Conjunctiva/sclera: Conjunctivae normal.  Neck:     Thyroid : No thyromegaly.     Vascular: No carotid bruit.  Cardiovascular:     Rate and Rhythm: Normal rate and regular rhythm.     Pulses: Normal pulses.     Heart sounds: Normal heart sounds.  Pulmonary:     Effort: Pulmonary effort is normal. No respiratory distress.     Breath sounds: No wheezing.  Abdominal:     General: Bowel sounds are normal.     Palpations: Abdomen is soft.     Tenderness: There is no abdominal tenderness.  Musculoskeletal:     Cervical back: Normal range of motion. No erythema.     Right lower leg: No edema.     Left lower leg: No edema.  Lymphadenopathy:     Cervical: No cervical adenopathy.  Skin:    General: Skin is warm and dry.     Findings: No rash.  Neurological:     Mental Status: She is alert and oriented to person, place, and time.     Cranial Nerves: No cranial nerve deficit.     Sensory: No sensory deficit.     Deep Tendon Reflexes: Reflexes are normal and symmetric.  Psychiatric:        Attention and Perception: Attention normal.        Mood and Affect: Mood normal.     Wt Readings from Last 3 Encounters:  02/29/24 143 lb (64.9 kg)  01/05/23 125 lb 3.2 oz (56.8 kg)  07/28/22 140 lb (63.5 kg)    BP 122/74   Pulse 78   Ht 5'  (1.524 m)   Wt 143 lb (64.9 kg)   SpO2 99%   BMI 27.93 kg/m   Assessment and Plan:  Problem List Items Addressed This Visit       Unprioritized   Mixed hyperlipidemia (Chronic)   Not currently on medications. Would likely benefit from LDL reduction to optimize CV risk. She agrees to a trial of Crestor .  Will have her follow up in 4 months.      Relevant Medications   rosuvastatin  (CRESTOR ) 5 MG tablet   Other Relevant Orders   Lipid panel   Hypothyroidism due to acquired atrophy of thyroid  (Chronic)   Supplemented.      Relevant Medications   levothyroxine  (SYNTHROID ) 50 MCG tablet   Other Relevant Orders   TSH + free T4   MCI (mild cognitive impairment) (Chronic)   Last Neurology note May 2025: Continue to follow with PCP and other medical providers for management/ optimization of chronic medical conditions, including vascular risk factors Try to be cognitively and physically active the best you can  Establish with occupational therapy for cognitive training, driving recommendations  Continue Memantine 10mg  twice a day        Relevant Orders   CBC with Differential/Platelet   Comprehensive metabolic panel with GFR   Other Visit Diagnoses       Annual physical exam    -  Primary   up to date on screenings and immunizations.  Encounter for screening mammogram for breast cancer       due this month - scheduled at [facility]     Screening for diabetes mellitus       Relevant Orders   Comprehensive metabolic panel with GFR   Hemoglobin A1c       Return in about 4 months (around 06/30/2024) for lipids  TOC/Dr. Lemon.    Leita HILARIO Adie, MD [facility] [place]

## 2024-03-01 LAB — COMPREHENSIVE METABOLIC PANEL WITH GFR
ALT: 15 IU/L (ref 0–32)
AST: 18 IU/L (ref 0–40)
Albumin: 4.5 g/dL (ref 3.9–4.9)
Alkaline Phosphatase: 79 IU/L (ref 44–121)
BUN/Creatinine Ratio: 11 — ABNORMAL LOW (ref 12–28)
BUN: 10 mg/dL (ref 8–27)
Bilirubin Total: 0.2 mg/dL (ref 0.0–1.2)
CO2: 21 mmol/L (ref 20–29)
Calcium: 9.6 mg/dL (ref 8.7–10.3)
Chloride: 105 mmol/L (ref 96–106)
Creatinine, Ser: 0.89 mg/dL (ref 0.57–1.00)
Globulin, Total: 2.6 g/dL (ref 1.5–4.5)
Glucose: 87 mg/dL (ref 70–99)
Potassium: 4.5 mmol/L (ref 3.5–5.2)
Sodium: 141 mmol/L (ref 134–144)
Total Protein: 7.1 g/dL (ref 6.0–8.5)
eGFR: 70 mL/min/1.73 (ref >59)

## 2024-03-01 LAB — LIPID PANEL
Chol/HDL Ratio: 5.6 ratio — ABNORMAL HIGH (ref 0.0–4.4)
Cholesterol, Total: 272 mg/dL — ABNORMAL HIGH (ref 100–199)
HDL: 49 mg/dL (ref >39)
LDL Chol Calc (NIH): 181 mg/dL — ABNORMAL HIGH (ref 0–99)
Triglycerides: 222 mg/dL — ABNORMAL HIGH (ref 0–149)
VLDL Cholesterol Cal: 42 mg/dL — ABNORMAL HIGH (ref 5–40)

## 2024-03-01 LAB — CBC WITH DIFFERENTIAL/PLATELET
Basophils Absolute: 0.1 x10E3/uL (ref 0.0–0.2)
Basos: 1 %
EOS (ABSOLUTE): 0.2 x10E3/uL (ref 0.0–0.4)
Eos: 3 %
Hematocrit: 43.8 % (ref 34.0–46.6)
Hemoglobin: 14.3 g/dL (ref 11.1–15.9)
Immature Grans (Abs): 0 x10E3/uL (ref 0.0–0.1)
Immature Granulocytes: 0 %
Lymphocytes Absolute: 1.8 x10E3/uL (ref 0.7–3.1)
Lymphs: 30 %
MCH: 30.8 pg (ref 26.6–33.0)
MCHC: 32.6 g/dL (ref 31.5–35.7)
MCV: 94 fL (ref 79–97)
Monocytes Absolute: 0.5 x10E3/uL (ref 0.1–0.9)
Monocytes: 8 %
Neutrophils Absolute: 3.6 x10E3/uL (ref 1.4–7.0)
Neutrophils: 58 %
Platelets: 239 x10E3/uL (ref 150–450)
RBC: 4.65 x10E6/uL (ref 3.77–5.28)
RDW: 12.8 % (ref 11.7–15.4)
WBC: 6.1 x10E3/uL (ref 3.4–10.8)

## 2024-03-01 LAB — HEMOGLOBIN A1C
Est. average glucose Bld gHb Est-mCnc: 117 mg/dL
Hgb A1c MFr Bld: 5.7 % — ABNORMAL HIGH (ref 4.8–5.6)

## 2024-03-01 LAB — TSH+FREE T4
Free T4: 1.18 ng/dL (ref 0.82–1.77)
TSH: 2.49 u[IU]/mL (ref 0.450–4.500)

## 2024-03-03 ENCOUNTER — Ambulatory Visit: Payer: Self-pay | Admitting: Internal Medicine

## 2024-03-03 DIAGNOSIS — E782 Mixed hyperlipidemia: Secondary | ICD-10-CM

## 2024-03-13 ENCOUNTER — Telehealth: Payer: Self-pay | Admitting: Internal Medicine

## 2024-03-13 NOTE — Telephone Encounter (Signed)
 Copied from CRM #[id number]. Topic: Medicare AWV >> Mar 13, 2024  2:26 PM Nathanel DEL wrote: Reason for CRM: Called LVM 03/13/2024 to schedule AWV. Please schedule Virtual or Telehealth visits ONLY  Nathanel Paschal; Care Guide Ambulatory Clinical Support [facility] Direct Dial: [phone]

## 2024-04-25 DIAGNOSIS — Z1231 Encounter for screening mammogram for malignant neoplasm of breast: Secondary | ICD-10-CM | POA: Diagnosis not present

## 2024-04-25 LAB — HM MAMMOGRAPHY

## 2024-05-28 ENCOUNTER — Ambulatory Visit (INDEPENDENT_AMBULATORY_CARE_PROVIDER_SITE_OTHER)

## 2024-05-28 DIAGNOSIS — Z0000 Encounter for general adult medical examination without abnormal findings: Secondary | ICD-10-CM

## 2024-05-28 DIAGNOSIS — Z780 Asymptomatic menopausal state: Secondary | ICD-10-CM

## 2024-05-28 NOTE — Progress Notes (Signed)
 No chief complaint on file.    Subjective:   Evelyn Daniels is a 68 y.o. female who presents for a Medicare Annual Wellness Visit.  Allergies (verified) Codeine   History: Past Medical History:  Diagnosis Date   Allergy    Thyroid  disease    Past Surgical History:  Procedure Laterality Date   TUBAL LIGATION     Family History  Problem Relation Age of Onset   Lung cancer Mother    Alcoholism Mother    Alzheimer's disease Father    Breast cancer Paternal Grandmother    Social History   Occupational History   Not on file  Tobacco Use   Smoking status: Never   Smokeless tobacco: Never  Vaping Use   Vaping status: Never Used  Substance and Sexual Activity   Alcohol use: Not Currently   Drug use: Never   Sexual activity: Yes    Partners: Male   Tobacco Counseling Counseling given: Not Answered  SDOH Screenings   Food Insecurity: No Food Insecurity (01/05/2023)  Housing: Unknown (11/30/2023)   Received from [facility]  Transportation Needs: No Transportation Needs (01/05/2023)  Utilities: Not At Risk (01/05/2023)  Alcohol Screen: Low Risk  (01/05/2023)  Depression (PHQ2-9): Low Risk  (02/29/2024)  Financial Resource Strain: Low Risk  (01/05/2023)  Physical Activity: Inactive (01/05/2023)  Stress: Stress Concern Present (01/05/2023)  Tobacco Use: Low Risk  (02/29/2024)   See flowsheets for full screening details  Depression Screen PHQ 2 & 9 Depression Scale- Over the past 2 weeks, how often have you been bothered by any of the following problems? Little interest or pleasure in doing things: 0 Feeling down, depressed, or hopeless (PHQ Adolescent also includes...irritable): 0 PHQ-2 Total Score: 0 Trouble falling or staying asleep, or sleeping too much: 0 Feeling tired or having little energy: 0 Poor appetite or overeating (PHQ Adolescent also includes...weight loss): 0 Feeling bad about yourself - or that you are a failure or have let yourself or your  family down: 0 Trouble concentrating on things, such as reading the newspaper or watching television (PHQ Adolescent also includes...like school work): 0 Moving or speaking so slowly that other people could have noticed. Or the opposite - being so fidgety or restless that you have been moving around a lot more than usual: 0 Thoughts that you would be better off dead, or of hurting yourself in some way: 0 PHQ-9 Total Score: 0 If you checked off any problems, how difficult have these problems made it for you to do your work, take care of things at home, or get along with other people?: Not difficult at all     Goals Addressed   None    Fall Screening Falls in the past year?: 0 Number of falls in past year: 0 Was there an injury with Fall?: 0 Fall Risk Category Calculator: 0 Patient Fall Risk Level: Low Fall Risk  Fall Risk Patient at Risk for Falls Due to: No Fall Risks Fall risk Follow up: Falls evaluation completed        Objective:    There were no vitals filed for this visit. There is no height or weight on file to calculate BMI.  Current Medications (verified) Outpatient Encounter Medications as of 05/28/2024  Medication Sig   CALCIUM  PO Take 650 mg by mouth in the morning and at bedtime.   Cholecalciferol 1000 UNITS capsule Take 1,000 Units by mouth daily.   Cinnamon 500 MG capsule Take 500 mg by mouth  daily.   levothyroxine  (SYNTHROID ) 50 MCG tablet Take 1 tablet (50 mcg total) by mouth daily.   loratadine (CLARITIN) 10 MG tablet Take 1 tablet by mouth daily.   memantine (NAMENDA) 10 MG tablet Take 10 mg by mouth 2 (two) times daily.   Multiple Vitamins-Minerals (WOMENS MULTIVITAMIN PLUS) TABS Take 1 tablet by mouth daily as needed.   rosuvastatin  (CRESTOR ) 5 MG tablet Take 1 tablet (5 mg total) by mouth daily.   No facility-administered encounter medications on file as of 05/28/2024.   Hearing/Vision screen No results found. Immunizations and Health  Maintenance Health Maintenance  Topic Date Due   Bone Density Scan  09/04/2024   Medicare Annual Wellness (AWV)  02/28/2025   Mammogram  04/25/2025   DTaP/Tdap/Td (2 - Td or Tdap) 05/13/2025   Colonoscopy  01/22/2028   Pneumococcal Vaccine: 50+ Years  Completed   Influenza Vaccine  Completed   Hepatitis C Screening  Completed   Zoster Vaccines- Shingrix  Completed   Meningococcal B Vaccine  Aged Out   COVID-19 Vaccine  Discontinued        Assessment/Plan:  This is a routine wellness examination for Evelyn Daniels.  Patient Care Team: Justus Leita DEL, MD as PCP - General (Internal Medicine)  I have personally reviewed and noted the following in the patient's chart:   Medical and social history Use of alcohol, tobacco or illicit drugs  Current medications and supplements including opioid prescriptions. Functional ability and status Nutritional status Physical activity Advanced directives List of other physicians Hospitalizations, surgeries, and ER visits in previous 12 months Vitals Screenings to include cognitive, depression, and falls Referrals and appointments  No orders of the defined types were placed in this encounter.  In addition, I have reviewed and discussed with patient certain preventive protocols, quality metrics, and best practice recommendations. A written personalized care plan for preventive services as well as general preventive health recommendations were provided to patient.   Jhonnie GORMAN Das, LPN   88/[id number]/7974   No follow-ups on file.  After Visit Summary: (MyChart) Due to this being a telephonic visit, the after visit summary with patients personalized plan was offered to patient via MyChart   Nurse Notes: UTD ON SHOTS; UTD ON MAMMOGRAM, COLONOSCOPY; ORDERED BDS

## 2024-05-28 NOTE — Patient Instructions (Addendum)
 Evelyn Daniels,  Thank you for taking the time for your Medicare Wellness Visit. I appreciate your continued commitment to your health goals. Please review the care plan we discussed, and feel free to reach out if I can assist you further.  Please note that Annual Wellness Visits do not include a physical exam. Some assessments may be limited, especially if the visit was conducted virtually. If needed, we may recommend an in-person follow-up with your provider.  Ongoing Care Seeing your primary care provider every 3 to 6 months helps us  monitor your health and provide consistent, personalized care.   Referrals If a referral was made during today's visit and you haven't received any updates within two weeks, please contact the referred provider directly to check on the status. REFERRAL SENT FOR BDS (SCHEDULE AFTER 3/5) You have an order for:  []   2D Mammogram  []   3D Mammogram  [x]   Bone Density     Please call for appointment:  [facility]  [address] [place] [address] [phone] [facility] [address] [place], [place] [address] [phone] [facility] [address] [place], [place] [address] [phone]   Make sure to wear two-piece clothing.  No lotions, powders, or deodorants the day of the appointment. Make sure to bring picture ID and insurance card.  Bring list of medications you are currently taking including any supplements.   Schedule your  screening mammogram through MyChart!   Log into your MyChart account.  Go to 'Visit' (or 'Appointments' if on mobile App) --> Schedule an Appointment  Under 'Select a Reason for Visit' choose the Mammogram Screening option.  Complete the pre-visit questions and select the time and place that best fits your schedule.   Recommended Screenings:  Health Maintenance  Topic Date Due    Osteoporosis screening with Bone Density Scan  09/04/2024   Breast Cancer Screening  04/25/2025   DTaP/Tdap/Td vaccine (2 - Td or Tdap) 05/13/2025   Medicare Annual Wellness Visit  05/28/2025   Colon Cancer Screening  01/22/2028   Pneumococcal Vaccine for age over 52  Completed   Flu Shot  Completed   Hepatitis C Screening  Completed   Zoster (Shingles) Vaccine  Completed   Meningitis B Vaccine  Aged Out   COVID-19 Vaccine  Discontinued   Vision: Annual vision screenings are recommended for early detection of glaucoma, cataracts, and diabetic retinopathy. These exams can also reveal signs of chronic conditions such as diabetes and high blood pressure.  Dental: Annual dental screenings help detect early signs of oral cancer, gum disease, and other conditions linked to overall health, including heart disease and diabetes.  Please see the attached documents for additional preventive care recommendations.   NEXT AWV 06/11/25 @ 3:10 PM IN PERSON

## 2024-07-04 ENCOUNTER — Encounter: Admitting: Student

## 2024-08-01 ENCOUNTER — Encounter: Payer: Self-pay | Admitting: Student

## 2024-08-01 ENCOUNTER — Ambulatory Visit: Admitting: Student

## 2024-08-01 VITALS — BP 150/80 | HR 74 | Ht 60.0 in | Wt 143.0 lb

## 2024-08-01 DIAGNOSIS — R030 Elevated blood-pressure reading, without diagnosis of hypertension: Secondary | ICD-10-CM

## 2024-08-01 DIAGNOSIS — M8580 Other specified disorders of bone density and structure, unspecified site: Secondary | ICD-10-CM

## 2024-08-01 DIAGNOSIS — E034 Atrophy of thyroid (acquired): Secondary | ICD-10-CM

## 2024-08-01 DIAGNOSIS — G3184 Mild cognitive impairment, so stated: Secondary | ICD-10-CM

## 2024-08-01 DIAGNOSIS — E782 Mixed hyperlipidemia: Secondary | ICD-10-CM

## 2024-08-01 DIAGNOSIS — R928 Other abnormal and inconclusive findings on diagnostic imaging of breast: Secondary | ICD-10-CM

## 2024-08-01 NOTE — Patient Instructions (Signed)
 [email or web address]

## 2024-08-01 NOTE — Assessment & Plan Note (Signed)
 She is taking vitamin D  and calcium  . Encouraged weight bearing exercise.

## 2024-08-01 NOTE — Assessment & Plan Note (Signed)
 Home BP around 130/80

## 2024-08-01 NOTE — Assessment & Plan Note (Addendum)
 Works as a sales executive. GI upset with donepezil, now on memantine 10 mg twice daily. Neurologist is [facility].

## 2024-08-01 NOTE — Assessment & Plan Note (Signed)
 Tolerating crestor  5 mg daily. The 10-year ASCVD risk score (Arnett DK, et al., 2019) is: 13% Continue current medication.

## 2024-08-01 NOTE — Progress Notes (Unsigned)
 "  Established Patient Office Visit  Subjective   Patient ID: Evelyn Daniels, female    DOB: January 15, 1956  Age: 69 y.o. MRN: [id number]  Chief Complaint  Patient presents with   Hyperlipidemia    Evelyn Daniels is a 69 y.o. person with medical hx listed below who presents today for follow up of HLD. She presents with her husband,  Aliene Tildon), who contributed to the history. Please refer to problem based charting for further details and assessment and plan of current problem and chronic medical conditions.   Patient Active Problem List   Diagnosis Date Noted   MCI (mild cognitive impairment) 03/24/2022   Abnormal mammogram of left breast 03/24/2022   Elevated BP without diagnosis of hypertension 03/24/2022   Trigger middle finger of right hand 04/22/2021   Primary osteoarthritis of left knee 12/10/2020   Osteopenia determined by x-ray 09/11/2019   Hypothyroidism due to acquired atrophy of thyroid  05/14/2015   Constipation 05/14/2015   Mixed hyperlipidemia 04/21/2015   History of colon polyps 04/21/2015   Hot flash, menopausal 04/21/2015   Plantar fasciitis 04/21/2015      ROS Refer to HPI    Objective:     Outpatient Encounter Medications as of 08/01/2024  Medication Sig   CALCIUM  PO Take 650 mg by mouth in the morning and at bedtime.   Cholecalciferol 1000 UNITS capsule Take 1,000 Units by mouth daily.   Cinnamon 500 MG capsule Take 500 mg by mouth daily.   levothyroxine  (SYNTHROID ) 50 MCG tablet Take 1 tablet (50 mcg total) by mouth daily.   loratadine (CLARITIN) 10 MG tablet Take 1 tablet by mouth daily.   memantine (NAMENDA) 10 MG tablet Take 10 mg by mouth 2 (two) times daily.   Multiple Vitamins-Minerals (WOMENS MULTIVITAMIN PLUS) TABS Take 1 tablet by mouth daily as needed.   rosuvastatin  (CRESTOR ) 5 MG tablet Take 1 tablet (5 mg total) by mouth daily.   No facility-administered encounter medications on file as of 08/01/2024.    BP (!) 150/80   Pulse 74   Ht 5'  (1.524 m)   Wt 143 lb (64.9 kg)   SpO2 97%   BMI 27.93 kg/m  BP Readings from Last 3 Encounters:  08/01/24 (!) 150/80  02/29/24 122/74  01/05/23 126/78    Physical Exam Constitutional:      Appearance: Normal appearance.  HENT:     Mouth/Throat:     Mouth: Mucous membranes are moist.     Pharynx: Oropharynx is clear.  Cardiovascular:     Rate and Rhythm: Normal rate and regular rhythm.  Pulmonary:     Effort: Pulmonary effort is normal.     Breath sounds: No rhonchi or rales.  Abdominal:     General: Abdomen is flat. Bowel sounds are normal. There is no distension.     Palpations: Abdomen is soft.     Tenderness: There is no abdominal tenderness.  Musculoskeletal:        General: Normal range of motion.     Right lower leg: No edema.     Left lower leg: No edema.  Skin:    General: Skin is warm and dry.     Capillary Refill: Capillary refill takes less than 2 seconds.  Neurological:     General: No focal deficit present.     Mental Status: She is alert and oriented to person, place, and time.  Psychiatric:        Mood and Affect: Mood normal.  Behavior: Behavior normal.        08/01/2024    3:52 PM 05/28/2024   12:46 PM 02/29/2024    9:44 AM  Depression screen PHQ 2/9  Decreased Interest 0 0 0  Down, Depressed, Hopeless 0 0 0  PHQ - 2 Score 0 0 0  Altered sleeping  0 0  Tired, decreased energy  0 0  Change in appetite  0 0  Feeling bad or failure about yourself   0 0  Trouble concentrating  0 0  Moving slowly or fidgety/restless  0 0  Suicidal thoughts  0 0  PHQ-9 Score  0 0   Difficult doing work/chores  Not difficult at all Not difficult at all     Data saved with a previous flowsheet row definition       08/01/2024    3:52 PM 02/29/2024    9:44 AM 01/05/2023   11:02 AM 07/28/2022    3:50 PM  GAD 7 : Generalized Anxiety Score  Nervous, Anxious, on Edge 0 0  0  0   Control/stop worrying 0 0  0  0   Worry too much - different things  0  0  0    Trouble relaxing  0  0  0   Restless  0  0  0   Easily annoyed or irritable  0  0  0   Afraid - awful might happen  0  0  0   Total GAD 7 Score  0 0 0  Anxiety Difficulty  Not difficult at all Not difficult at all Not difficult at all     Data saved with a previous flowsheet row definition    No results found for any visits on 08/01/24.  Last CBC Lab Results  Component Value Date   WBC 6.1 02/29/2024   HGB 14.3 02/29/2024   HCT 43.8 02/29/2024   MCV 94 02/29/2024   MCH 30.8 02/29/2024   RDW 12.8 02/29/2024   PLT 239 02/29/2024   Last metabolic panel Lab Results  Component Value Date   GLUCOSE 87 02/29/2024   NA 141 02/29/2024   K 4.5 02/29/2024   CL 105 02/29/2024   CO2 21 02/29/2024   BUN 10 02/29/2024   CREATININE 0.89 02/29/2024   EGFR 70 02/29/2024   CALCIUM  9.6 02/29/2024   PROT 7.1 02/29/2024   ALBUMIN 4.5 02/29/2024   LABGLOB 2.6 02/29/2024   AGRATIO 2.4 (H) 12/30/2021   BILITOT 0.2 02/29/2024   ALKPHOS 79 02/29/2024   AST 18 02/29/2024   ALT 15 02/29/2024   Last lipids Lab Results  Component Value Date   CHOL 272 (H) 02/29/2024   HDL 49 02/29/2024   LDLCALC 181 (H) 02/29/2024   TRIG 222 (H) 02/29/2024   CHOLHDL 5.6 (H) 02/29/2024   Last hemoglobin A1c Lab Results  Component Value Date   HGBA1C 5.7 (H) 02/29/2024      The 10-year ASCVD risk score (Arnett DK, et al., 2019) is: 13%    Assessment & Plan:  Mixed hyperlipidemia Assessment & Plan: Tolerating crestor  5 mg daily. The 10-year ASCVD risk score (Arnett DK, et al., 2019) is: 13% Continue current medication.     MCI (mild cognitive impairment) Assessment & Plan: Works as a sales executive. GI upset with donepezil, now on memantine 10 mg twice daily. Neurologist is [employer].   Elevated BP without diagnosis of hypertension Assessment & Plan: Home BP around 130/80 per husband. BP elevated to 150/80 in  office today.  Given home pressure has been reasonable will have her keep  home log and follow up BP in 1 month. If persistently elevated will discuss ARB.    Hypothyroidism due to acquired atrophy of thyroid  Assessment & Plan: No new symptoms today. Currently levothyroxine  50 mcg daily, TSH 2.49 on 02/2024. Continue current dosage.    Osteopenia determined by x-ray Assessment & Plan: She is taking vitamin D  and calcium  . Encouraged weight bearing exercise.   Abnormal mammogram of left breast Assessment & Plan: 04/21/2024 mammogram without suspicion for malignancy, dense breast tissue, no significant change from piror study continue yearly screening.      Return in about 4 weeks (around 08/29/2024) for Blood pressure.    Harlene Saddler, MD "

## 2024-08-01 NOTE — Assessment & Plan Note (Signed)
 No new symptoms today. Currently levothyroxine  50 mcg daily, TSH 2.49 on 02/2024. Continue current dosage.

## 2024-08-08 NOTE — Assessment & Plan Note (Signed)
 04/21/2024 mammogram without suspicion for malignancy, dense breast tissue, no significant change from piror study continue yearly screening.

## 2024-09-05 ENCOUNTER — Ambulatory Visit: Admitting: Student

## 2025-06-11 ENCOUNTER — Ambulatory Visit
